# Patient Record
Sex: Male | Born: 1935 | Race: White | Hispanic: No | Smoking: Never smoker
Health system: Southern US, Community
[De-identification: ages and names within clinical notes are randomized; demographics above are authoritative.]

## PROBLEM LIST (undated history)

## (undated) DIAGNOSIS — E039 Hypothyroidism, unspecified: Secondary | ICD-10-CM

## (undated) DIAGNOSIS — C069 Malignant neoplasm of mouth, unspecified: Secondary | ICD-10-CM

## (undated) DIAGNOSIS — J45909 Unspecified asthma, uncomplicated: Secondary | ICD-10-CM

## (undated) DIAGNOSIS — H409 Unspecified glaucoma: Secondary | ICD-10-CM

## (undated) DIAGNOSIS — N400 Enlarged prostate without lower urinary tract symptoms: Secondary | ICD-10-CM

## (undated) HISTORY — DX: Unspecified glaucoma: H40.9

## (undated) HISTORY — DX: Unspecified asthma, uncomplicated: J45.909

## (undated) HISTORY — DX: Benign prostatic hyperplasia without lower urinary tract symptoms: N40.0

## (undated) HISTORY — DX: Malignant neoplasm of mouth, unspecified: C06.9

---

## 2010-12-31 HISTORY — PX: OTHER SURGICAL HISTORY: SHX169

## 2013-12-31 HISTORY — PX: POSTERIOR FUSION SPINAL DEFORMITY: SUR1044

## 2015-08-25 DIAGNOSIS — J4530 Mild persistent asthma, uncomplicated: Secondary | ICD-10-CM | POA: Insufficient documentation

## 2015-08-25 DIAGNOSIS — Z85819 Personal history of malignant neoplasm of unspecified site of lip, oral cavity, and pharynx: Secondary | ICD-10-CM | POA: Insufficient documentation

## 2015-09-09 ENCOUNTER — Ambulatory Visit: Payer: Medicare Other | Admitting: Urology

## 2015-10-28 ENCOUNTER — Encounter: Payer: Self-pay | Admitting: Urology

## 2015-10-28 ENCOUNTER — Ambulatory Visit (INDEPENDENT_AMBULATORY_CARE_PROVIDER_SITE_OTHER): Payer: Medicare Other | Admitting: Urology

## 2015-10-28 VITALS — BP 151/82 | HR 69 | Ht 69.0 in | Wt 183.2 lb

## 2015-10-28 DIAGNOSIS — N401 Enlarged prostate with lower urinary tract symptoms: Secondary | ICD-10-CM

## 2015-10-28 DIAGNOSIS — N400 Enlarged prostate without lower urinary tract symptoms: Secondary | ICD-10-CM | POA: Diagnosis not present

## 2015-10-28 DIAGNOSIS — N138 Other obstructive and reflux uropathy: Secondary | ICD-10-CM

## 2015-10-28 LAB — URINALYSIS, COMPLETE
Bilirubin, UA: NEGATIVE
GLUCOSE, UA: NEGATIVE
Ketones, UA: NEGATIVE
Leukocytes, UA: NEGATIVE
Nitrite, UA: NEGATIVE
PROTEIN UA: NEGATIVE
RBC, UA: NEGATIVE
SCAN RESULT: 15
Specific Gravity, UA: 1.025 (ref 1.005–1.030)
Urobilinogen, Ur: 0.2 mg/dL (ref 0.2–1.0)
pH, UA: 5 (ref 5.0–7.5)

## 2015-10-28 LAB — MICROSCOPIC EXAMINATION
BACTERIA UA: NONE SEEN
Epithelial Cells (non renal): NONE SEEN /hpf (ref 0–10)
RBC, UA: NONE SEEN /hpf (ref 0–?)
Renal Epithel, UA: NONE SEEN /hpf
WBC, UA: NONE SEEN /hpf (ref 0–?)

## 2015-10-28 NOTE — Progress Notes (Signed)
10/28/2015 8:54 AM   Dakota Garza 07-02-1923 [id number]  Referring provider: Kirk Ruths, MD [facility] [place], [place] [address]  Chief Complaint  Patient presents with  . Benign Prostatic Hypertrophy    referred by Dr. Frazier Richards    HPI: Patient is a 81 year old white male with BPH and LUTS was currently on tamsulosin 0.4 mg and finasteride 5 mg daily who has recently relocated here from [place]. He is having a worsening of his urinary symptoms over the last few months. Before he left [place], his urologist suggested increasing his tamsulosin and finasteride. He has not done that yet.  He did undergo a prostate vaporization in 2012. He is not anxious to undergo any further surgeries at this time.  His current symptoms consist of frequent urination, nocturia, intermittency, hesitancy and weak urinary stream.  He is I PSS score is 16/3. His PVR is 15 mL.  His UA is unremarkable.  He is not having dysuria, hematuria or suprapubic pain. He also is not having fevers, chills, nausea or vomiting.        IPSS      10/28/15 0800       International Prostate Symptom Score   How often have you had the sensation of not emptying your bladder? About half the time     How often have you had to urinate less than every two hours? About half the time     How often have you found you stopped and started again several times when you urinated? More than half the time     How often have you found it difficult to postpone urination? Not at All     How often have you had a weak urinary stream? More than half the time     How often have you had to strain to start urination? Not at All     How many times did you typically get up at night to urinate? 2 Times     Total IPSS Score 16     Quality of Life due to urinary symptoms   If you were to spend the rest of your life with your urinary condition just the way it is now how would you feel about that?  Mixed        Score:  1-7 Mild 8-19 Moderate 20-35 Severe  PMH: Past Medical History  Diagnosis Date  . Asthma in adult without complication   . Oral cancer ([place])   . BPH (benign prostatic hyperplasia)   . Glaucoma     Surgical History: Past Surgical History  Procedure Laterality Date  . Prostate vaporization  2012  . Posterior fusion spinal deformity  2015  . Oral cancer surgery  2012    Home Medications:    Medication List       This list is accurate as of: 10/28/15  8:54 AM.  Always use your most recent med list.               albuterol 108 (90 BASE) MCG/ACT inhaler  Commonly known as:  PROVENTIL HFA;VENTOLIN HFA  Inhale 2 puffs into the lungs every 6 (six) hours as needed for wheezing or shortness of breath.     aspirin EC 81 MG tablet  Take 81 mg by mouth daily.     budesonide-formoterol 160-4.5 MCG/ACT inhaler  Commonly known as:  SYMBICORT  Inhale 2 puffs into the lungs 2 (two) times daily.  cetirizine 10 MG tablet  Commonly known as:  ZYRTEC  Take 10 mg by mouth as needed for allergies.     CoQ10 100 MG Caps  Take 100 mg by mouth daily.     finasteride 5 MG tablet  Commonly known as:  PROSCAR  Take 5 mg by mouth daily.     ipratropium 0.03 % nasal spray  Commonly known as:  ATROVENT  Place 2 sprays into both nostrils every 12 (twelve) hours.     levothyroxine 50 MCG tablet  Commonly known as:  SYNTHROID, LEVOTHROID  Take 50 mcg by mouth daily before breakfast.     metoprolol tartrate 25 MG tablet  Commonly known as:  LOPRESSOR  Take 25 mg by mouth 2 (two) times daily.     montelukast 10 MG tablet  Commonly known as:  SINGULAIR  Take 10 mg by mouth at bedtime.     multivitamin tablet  Take 1 tablet by mouth daily.     polyethylene glycol packet  Commonly known as:  MIRALAX / GLYCOLAX  Take 17 g by mouth daily.     pravastatin 40 MG tablet  Commonly known as:  PRAVACHOL  Take 40 mg by mouth daily.     PRESERVISION AREDS PO    Take 1 tablet by mouth daily.     PROBIOTIC PO  Take 1 tablet by mouth daily.     tamsulosin 0.4 MG Caps capsule  Commonly known as:  FLOMAX  Take 0.4 mg by mouth.        Allergies: No Known Allergies  Family History: Family History  Problem Relation Age of Onset  . Coronary artery disease Mother   . Alzheimer's disease Father   . Coronary artery disease Sister   . Kidney disease Neg Hx   . Prostate cancer Neg Hx     Social History:  reports that he has never smoked. He does not have any smokeless tobacco history on file. He reports that he does not drink alcohol. His drug history is not on file.  ROS: UROLOGY Frequent Urination?: Yes Hard to postpone urination?: No Burning/pain with urination?: No Get up at night to urinate?: Yes Leakage of urine?: No Urine stream starts and stops?: Yes Trouble starting stream?: Yes Do you have to strain to urinate?: No Blood in urine?: No Urinary tract infection?: No Sexually transmitted disease?: No Injury to kidneys or bladder?: No Painful intercourse?: No Weak stream?: Yes Erection problems?: No Penile pain?: No  Gastrointestinal Nausea?: No Vomiting?: No Indigestion/heartburn?: No Diarrhea?: No Constipation?: Yes  Constitutional Fever: No Night sweats?: No Weight loss?: No Fatigue?: No  Skin Skin rash/lesions?: No Itching?: No  Eyes Blurred vision?: No Double vision?: No  Ears/Nose/Throat Sore throat?: No Sinus problems?: No  Hematologic/Lymphatic Swollen glands?: No Easy bruising?: Yes  Cardiovascular Leg swelling?: No Chest pain?: No  Respiratory Cough?: No Shortness of breath?: No  Endocrine Excessive thirst?: No  Musculoskeletal Back pain?: Yes Joint pain?: No  Neurological Headaches?: No Dizziness?: Yes  Psychologic Depression?: No Anxiety?: No  Physical Exam: BP 151/82 mmHg  Pulse 69  Ht 5\' 9"  (1.753 m)  Wt 183 lb 3.2 oz (83.099 kg)  BMI 27.04 kg/m2  Constitutional:  Well nourished. Alert and oriented, No acute distress. HEENT: [place] AT, moist mucus membranes. Trachea midline, no masses. Cardiovascular: No clubbing, cyanosis, or edema. Respiratory: Normal respiratory effort, no increased work of breathing. GI: Abdomen is soft, non tender, non distended, no abdominal masses. Liver and spleen not  palpable.  No hernias appreciated.  Stool sample for occult testing is not indicated.   GU: No CVA tenderness.  No bladder fullness or masses.  Patient with uncircumcised phallus. Foreskin easily retracted  Urethral meatus is patent.  No penile discharge. No penile lesions or rashes. Scrotum without lesions, cysts, rashes and/or edema.  Testicles are located scrotally bilaterally. No masses are appreciated in the testicles. Left and right epididymis are normal. Rectal: Patient with  normal sphincter tone. Anus and perineum without scarring or rashes. No rectal masses are appreciated. Prostate is approximately 50 grams, no nodules are appreciated. Firm in the right lobe.  Seminal vesicles are normal. Skin: No rashes, bruises or suspicious lesions. Lymph: No cervical or inguinal adenopathy. Neurologic: Grossly intact, no focal deficits, moving all 4 extremities. Psychiatric: Normal mood and affect.  Laboratory Data: Urinalysis Results for orders placed or performed in visit on 10/28/15  Microscopic Examination  Result Value Ref Range   WBC, UA None seen 0 -  5 /hpf   RBC, UA None seen 0 -  2 /hpf   Epithelial Cells (non renal) None seen 0 - 10 /hpf   Renal Epithel, UA None seen None seen /hpf   Mucus, UA Present (A) Not Estab.   Bacteria, UA None seen None seen/Few  Urinalysis, Complete  Result Value Ref Range   Scan Result 15   Urinalysis, Complete  Result Value Ref Range   Specific Gravity, UA 1.025 1.005 - 1.030   pH, UA 5.0 5.0 - 7.5   Color, UA Yellow Yellow   Appearance Ur Clear Clear   Leukocytes, UA Negative Negative   Protein, UA Negative  Negative/Trace   Glucose, UA Negative Negative   Ketones, UA Negative Negative   RBC, UA Negative Negative   Bilirubin, UA Negative Negative   Urobilinogen, Ur 0.2 0.2 - 1.0 mg/dL   Nitrite, UA Negative Negative   Microscopic Examination See below:     Pertinent Imaging: Results for LION, FERNANDEZ (MRN [id number]) as of 10/28/2015 09:36  Ref. Range 10/28/2015 08:42  Scan Result Unknown 15    Assessment & Plan:    1. BPH with LUTS:   Patient's IPSS score is 16/3.  His PVR 15 mL.  His DRE demonstrates mild enlargement.    Patient would like to increase his tamsulosin and finasteride.   I do not know if this would provide more benefit, but he is not anxious to have an outlet procedure at this time. He will follow up in 3 months for a PSA, PVR and an IPSS score.     Return in about 3 months (around 01/28/2016) for IPSS and PVR.  Zara Council, [facility] [address] [place], [place] [address] [phone]

## 2015-10-31 ENCOUNTER — Encounter: Payer: Self-pay | Admitting: Urology

## 2015-10-31 DIAGNOSIS — N401 Enlarged prostate with lower urinary tract symptoms: Secondary | ICD-10-CM | POA: Insufficient documentation

## 2015-12-22 ENCOUNTER — Other Ambulatory Visit: Payer: Self-pay

## 2015-12-22 DIAGNOSIS — N400 Enlarged prostate without lower urinary tract symptoms: Secondary | ICD-10-CM

## 2015-12-22 MED ORDER — TAMSULOSIN HCL 0.4 MG PO CAPS: 0.4000 mg | ORAL_CAPSULE | Freq: Every day | ORAL | Status: DC

## 2015-12-22 MED ORDER — FINASTERIDE 5 MG PO TABS: 5.0000 mg | ORAL_TABLET | Freq: Every day | ORAL | Status: DC

## 2015-12-22 NOTE — Progress Notes (Signed)
Pt requested a refill to reflect tamsulosin twice a day.

## 2016-01-30 ENCOUNTER — Encounter: Payer: Self-pay | Admitting: Urology

## 2016-01-30 ENCOUNTER — Ambulatory Visit (INDEPENDENT_AMBULATORY_CARE_PROVIDER_SITE_OTHER): Payer: Medicare Other | Admitting: Urology

## 2016-01-30 VITALS — BP 144/72 | HR 80 | Ht 69.0 in | Wt 187.2 lb

## 2016-01-30 DIAGNOSIS — N138 Other obstructive and reflux uropathy: Secondary | ICD-10-CM

## 2016-01-30 DIAGNOSIS — N401 Enlarged prostate with lower urinary tract symptoms: Secondary | ICD-10-CM

## 2016-01-30 LAB — BLADDER SCAN AMB NON-IMAGING: SCAN RESULT: 0

## 2016-01-30 NOTE — Progress Notes (Signed)
8:45 AM   Dakota Garza 28-May-1923 [id number]  Referring provider: Kirk Ruths, MD [facility] [place], [place] [address]  Chief Complaint  Patient presents with  . Benign Prostatic Hypertrophy    3 month follow up    HPI: Patient is a 82 year old Caucasian male with BPH and LUTS who presents today for 3 month follow-up after increasing his tamsulosin from 0.4 mg to 0.8 mg daily and finasteride 5 mg daily to 10 mg daily.  He states he only saw a marginal benefit in increasing the medication. He would like to return to his original dosing schedule of tamsulosin 0.4 mg and finasteride 5 mg daily.  He is experiencing nocturia 2 and urinary intermittency. His PVR today is 0 mL.  He is not had any recent dysuria, gross hematuria or suprapubic pain. He has not had any recent fevers, chills, nausea or vomiting.   He generally feels well.    He did undergo a prostate vaporization in 2012. He is not wanting  to undergo any further surgeries at this time.  His IPSS score is 11/2, the prior score was 16/3.  His PVR is 0 mL and his previous PVR was 15 mL.        IPSS      01/30/16 0800       International Prostate Symptom Score   How often have you had the sensation of not emptying your bladder? Less than 1 in 5     How often have you had to urinate less than every two hours? About half the time     How often have you found you stopped and started again several times when you urinated? Less than half the time     How often have you found it difficult to postpone urination? Not at All     How often have you had a weak urinary stream? About half the time     How often have you had to strain to start urination? Not at All     How many times did you typically get up at night to urinate? 2 Times     Total IPSS Score 11     Quality of Life due to urinary symptoms   If you were to spend the rest of your life with your urinary condition just the way it is  now how would you feel about that? Mostly Satisfied        Score:  1-7 Mild 8-19 Moderate 20-35 Severe  PMH: Past Medical History  Diagnosis Date  . Asthma in adult without complication   . Oral cancer ([place])   . BPH (benign prostatic hyperplasia)   . Glaucoma     Surgical History: Past Surgical History  Procedure Laterality Date  . Prostate vaporization  2012  . Posterior fusion spinal deformity  2015  . Oral cancer surgery  2012    Home Medications:    Medication List       This list is accurate as of: 01/30/16  8:45 AM.  Always use your most recent med list.               albuterol 108 (90 Base) MCG/ACT inhaler  Commonly known as:  PROVENTIL HFA;VENTOLIN HFA  Inhale 2 puffs into the lungs every 6 (six) hours as needed for wheezing or shortness of breath.     aspirin EC 81 MG tablet  Take 81 mg by mouth daily.  budesonide-formoterol 160-4.5 MCG/ACT inhaler  Commonly known as:  SYMBICORT  Inhale 2 puffs into the lungs 2 (two) times daily.     cetirizine 10 MG tablet  Commonly known as:  ZYRTEC  Take 10 mg by mouth as needed for allergies.     CoQ10 100 MG Caps  Take 100 mg by mouth daily.     finasteride 5 MG tablet  Commonly known as:  PROSCAR  Take 1 tablet (5 mg total) by mouth daily.     ipratropium 0.03 % nasal spray  Commonly known as:  ATROVENT  Place 2 sprays into both nostrils every 12 (twelve) hours. Reported on 01/30/2016     levothyroxine 50 MCG tablet  Commonly known as:  SYNTHROID, LEVOTHROID  Take 50 mcg by mouth daily before breakfast.     metoprolol tartrate 25 MG tablet  Commonly known as:  LOPRESSOR  Take 25 mg by mouth 2 (two) times daily.     montelukast 10 MG tablet  Commonly known as:  SINGULAIR  Take 10 mg by mouth at bedtime.     multivitamin tablet  Take 1 tablet by mouth daily.     polyethylene glycol packet  Commonly known as:  MIRALAX / GLYCOLAX  Take 17 g by mouth daily.     pravastatin 40 MG tablet    Commonly known as:  PRAVACHOL  Take 40 mg by mouth daily.     PRESERVISION AREDS PO  Take 1 tablet by mouth daily.     PROBIOTIC PO  Take 1 tablet by mouth daily.     tamsulosin 0.4 MG Caps capsule  Commonly known as:  FLOMAX  Take 1 capsule (0.4 mg total) by mouth daily.        Allergies: No Known Allergies  Family History: Family History  Problem Relation Age of Onset  . Coronary artery disease Mother   . Alzheimer's disease Father   . Coronary artery disease Sister   . Kidney disease Neg Hx   . Prostate cancer Neg Hx     Social History:  reports that he has never smoked. He does not have any smokeless tobacco history on file. He reports that he drinks alcohol. He reports that he does not use illicit drugs.  ROS: UROLOGY Frequent Urination?: No Hard to postpone urination?: No Burning/pain with urination?: No Get up at night to urinate?: Yes Leakage of urine?: No Urine stream starts and stops?: Yes Trouble starting stream?: No Do you have to strain to urinate?: No Blood in urine?: No Urinary tract infection?: No Sexually transmitted disease?: No Injury to kidneys or bladder?: No Painful intercourse?: No Weak stream?: No Erection problems?: No Penile pain?: No  Gastrointestinal Nausea?: No Vomiting?: No Indigestion/heartburn?: No Diarrhea?: No Constipation?: Yes  Constitutional Fever: No Night sweats?: No Weight loss?: No Fatigue?: No  Skin Skin rash/lesions?: No Itching?: No  Eyes Blurred vision?: No Double vision?: No  Ears/Nose/Throat Sore throat?: No Sinus problems?: No  Hematologic/Lymphatic Swollen glands?: No Easy bruising?: No  Cardiovascular Leg swelling?: No Chest pain?: No  Respiratory Cough?: No Shortness of breath?: No  Endocrine Excessive thirst?: No  Musculoskeletal Back pain?: Yes Joint pain?: No  Neurological Headaches?: No Dizziness?: No  Psychologic Depression?: No Anxiety?: No  Physical  Exam: BP 144/72 mmHg  Pulse 80  Ht 5\' 9"  (1.753 m)  Wt 187 lb 3.2 oz (84.913 kg)  BMI 27.63 kg/m2  Constitutional: Well nourished. Alert and oriented, No acute distress. HEENT: [place] AT, moist mucus  membranes. Trachea midline, no masses. Cardiovascular: No clubbing, cyanosis, or edema. Respiratory: Normal respiratory effort, no increased work of breathing. Skin: No rashes, bruises or suspicious lesions. Lymph: No cervical or inguinal adenopathy. Neurologic: Grossly intact, no focal deficits, moving all 4 extremities. Psychiatric: Normal mood and affect.  Laboratory Data: Pertinent Imaging: Results for Dakota Garza, Dakota Garza (MRN [id number]) as of 01/30/2016 08:51  Ref. Range 01/30/2016 08:32  Scan Result Unknown 0   Assessment & Plan:    1. BPH with LUTS:   Patient's IPSS score is 11/2.  His PVR 0 mL.   Patient will continue his tamsulosin 0.4 mg daily and finasteride 5 mg daily.  He will follow up in 9 months for a PSA, exam, PVR and an IPSS score.     Return in about 9 months (around 10/29/2016) for IPSS score and exam.  Zara Council, [facility] [address], [place], [place] [address] [phone]

## 2016-02-27 DIAGNOSIS — C44310 Basal cell carcinoma of skin of unspecified parts of face: Secondary | ICD-10-CM | POA: Insufficient documentation

## 2016-02-27 DIAGNOSIS — E7800 Pure hypercholesterolemia, unspecified: Secondary | ICD-10-CM | POA: Insufficient documentation

## 2016-10-29 ENCOUNTER — Encounter: Payer: Self-pay | Admitting: Urology

## 2016-10-29 ENCOUNTER — Ambulatory Visit: Payer: Medicare Other | Admitting: Urology

## 2016-10-29 DIAGNOSIS — N401 Enlarged prostate with lower urinary tract symptoms: Secondary | ICD-10-CM

## 2016-10-29 MED ORDER — TAMSULOSIN HCL 0.4 MG PO CAPS: 0.4000 mg | ORAL_CAPSULE | Freq: Every day | ORAL | 4 refills | Status: DC

## 2016-10-29 MED ORDER — FINASTERIDE 5 MG PO TABS: 5.0000 mg | ORAL_TABLET | Freq: Every day | ORAL | 4 refills | Status: DC

## 2016-10-29 NOTE — Progress Notes (Signed)
8:52 AM   [name] 02-25-1923 [id number]  Referring provider: Kirk Ruths, MD [facility] [place], [place] [address]  Chief Complaint  Patient presents with  . Benign Prostatic Hypertrophy    HPI: Patient is a 82 year old Caucasian male with BPH and LUTS who presents today for 9 month follow-up.  BPH WITH LUTS His IPSS score today is 11, which is moderate lower urinary tract symptomatology. He is mostly satisfied with his quality life due to his urinary symptoms.  His previous IPSS score was 11/2.  His major complaint today nocturia x 1, intermittency, hesitancy and a weak stream.  He has had these symptoms for several years.  He denies any dysuria, hematuria or suprapubic pain.   He currently taking tamsulosin 0.4 mg daily and finasteride 5 mg daily.  He did undergo a prostate vaporization in 2012.  He is not wanting  to undergo any further surgeries at this time.  He also denies any recent fevers, chills, nausea or vomiting.  He does not have a family history of PCa.      IPSS    Row Name 10/29/16 0800         International Prostate Symptom Score   How often have you had the sensation of not emptying your bladder? Less than half the time     How often have you had to urinate less than every two hours? Less than half the time     How often have you found you stopped and started again several times when you urinated? Less than half the time     How often have you found it difficult to postpone urination? Not at All     How often have you had a weak urinary stream? About half the time     How often have you had to strain to start urination? Less than 1 in 5 times     How many times did you typically get up at night to urinate? 1 Time     Total IPSS Score 11       Quality of Life due to urinary symptoms   If you were to spend the rest of your life with your urinary condition just the way it is now how would you feel about that? Mostly  Satisfied        Score:  1-7 Mild 8-19 Moderate 20-35 Severe   PMH: Past Medical History:  Diagnosis Date  . Asthma in adult without complication   . BPH (benign prostatic hyperplasia)   . Glaucoma   . Oral cancer [facility])     Surgical History: Past Surgical History:  Procedure Laterality Date  . Oral cancer Surgery  2012  . POSTERIOR FUSION SPINAL DEFORMITY  2015  . prostate vaporization  2012    Home Medications:    Medication List       Accurate as of 10/29/16  8:52 AM. Always use your most recent med list.          albuterol 108 (90 Base) MCG/ACT inhaler Commonly known as:  PROVENTIL HFA;VENTOLIN HFA Inhale 2 puffs into the lungs every 6 (six) hours as needed for wheezing or shortness of breath.   aspirin EC 81 MG tablet Take 81 mg by mouth daily.   budesonide-formoterol 160-4.5 MCG/ACT inhaler Commonly known as:  SYMBICORT Inhale 2 puffs into the lungs 2 (two) times daily.   cetirizine 10 MG tablet Commonly known as:  ZYRTEC Take  10 mg by mouth as needed for allergies.   CoQ10 100 MG Caps Take 100 mg by mouth daily.   finasteride 5 MG tablet Commonly known as:  PROSCAR Take 1 tablet (5 mg total) by mouth daily.   ipratropium 0.03 % nasal spray Commonly known as:  ATROVENT Place 2 sprays into both nostrils every 12 (twelve) hours. Reported on 01/30/2016   levothyroxine 50 MCG tablet Commonly known as:  SYNTHROID, LEVOTHROID Take 50 mcg by mouth daily before breakfast.   metoprolol tartrate 25 MG tablet Commonly known as:  LOPRESSOR Take 25 mg by mouth 2 (two) times daily.   montelukast 10 MG tablet Commonly known as:  SINGULAIR Take 10 mg by mouth at bedtime.   multivitamin tablet Take 1 tablet by mouth daily.   polyethylene glycol packet Commonly known as:  MIRALAX / GLYCOLAX Take 17 g by mouth daily.   pravastatin 40 MG tablet Commonly known as:  PRAVACHOL Take 40 mg by mouth daily.   PRESERVISION AREDS PO Take 1 tablet by mouth  daily.   PROBIOTIC PO Take 1 tablet by mouth daily.   tamsulosin 0.4 MG Caps capsule Commonly known as:  FLOMAX Take 1 capsule (0.4 mg total) by mouth daily.       Allergies: No Known Allergies  Family History: Family History  Problem Relation Age of Onset  . Coronary artery disease Mother   . Alzheimer's disease Father   . Coronary artery disease Sister   . Kidney disease Neg Hx   . Prostate cancer Neg Hx     Social History:  reports that he has never smoked. He has never used smokeless tobacco. He reports that he drinks alcohol. He reports that he does not use drugs.  ROS: UROLOGY Frequent Urination?: No Hard to postpone urination?: No Burning/pain with urination?: No Get up at night to urinate?: Yes Leakage of urine?: No Urine stream starts and stops?: Yes Trouble starting stream?: Yes Do you have to strain to urinate?: No Blood in urine?: No Urinary tract infection?: No Sexually transmitted disease?: No Injury to kidneys or bladder?: No Painful intercourse?: No Weak stream?: Yes Erection problems?: No Penile pain?: No  Gastrointestinal Nausea?: No Vomiting?: No Indigestion/heartburn?: No Diarrhea?: No Constipation?: Yes  Constitutional Fever: No Night sweats?: No Weight loss?: No Fatigue?: No  Skin Skin rash/lesions?: No Itching?: No  Eyes Blurred vision?: No Double vision?: No  Ears/Nose/Throat Sore throat?: No Sinus problems?: Yes  Hematologic/Lymphatic Swollen glands?: No Easy bruising?: No  Cardiovascular Leg swelling?: No Chest pain?: No  Respiratory Cough?: Yes Shortness of breath?: No  Endocrine Excessive thirst?: No  Musculoskeletal Back pain?: Yes Joint pain?: No  Neurological Headaches?: No Dizziness?: No  Psychologic Depression?: No Anxiety?: No  Physical Exam: BP (!) 153/79 (BP Location: Left Arm, Patient Position: Sitting, Cuff Size: Normal)   Pulse 80   Ht 5\' 9"  (1.753 m)   Wt 182 lb 11.2 oz (82.9  kg)   BMI 26.98 kg/m   Constitutional: Well nourished. Alert and oriented, No acute distress. HEENT: [place] AT, moist mucus membranes. Trachea midline, no masses. Cardiovascular: No clubbing, cyanosis, or edema. Respiratory: Normal respiratory effort, no increased work of breathing. GI: Abdomen is soft, non tender, non distended, no abdominal masses. Liver and spleen not palpable.  No hernias appreciated.  Stool sample for occult testing is not indicated.   GU: No CVA tenderness.  No bladder fullness or masses.  Patient with uncircumcised phallus.  Foreskin easily retracted Urethral meatus is  patent.  No penile discharge. No penile lesions or rashes. Scrotum without lesions, cysts, rashes and/or edema.  Testicles are located scrotally bilaterally. No masses are appreciated in the testicles. Left and right epididymis are normal. Rectal: Patient with  normal sphincter tone. Anus and perineum without scarring or rashes. No rectal masses are appreciated. Prostate is approximately 50 grams, firm in the right lobe, no nodules are appreciated. Seminal vesicles are normal. Skin: No rashes, bruises or suspicious lesions. Lymph: No cervical or inguinal adenopathy. Neurologic: Grossly intact, no focal deficits, moving all 4 extremities. Psychiatric: Normal mood and affect.    Assessment & Plan:    1. BPH with LUTS  - IPSS score is 11/2, it is stable  - Continue conservative management, avoiding bladder irritants and timed voiding's  - Continue tamsulosin 0.4 mg daily and finasteride 5 mg daily; refills given  - RTC in 12 months for IPSS and exam   Return in about 1 year (around 10/29/2017) for IPSS and exam.  Zara Council, [facility]  [facility] [address], [place], [place] [address] [phone]

## 2017-06-11 DIAGNOSIS — K219 Gastro-esophageal reflux disease without esophagitis: Secondary | ICD-10-CM | POA: Insufficient documentation

## 2017-09-25 DIAGNOSIS — E039 Hypothyroidism, unspecified: Secondary | ICD-10-CM | POA: Insufficient documentation

## 2017-09-25 DIAGNOSIS — Z0000 Encounter for general adult medical examination without abnormal findings: Secondary | ICD-10-CM | POA: Insufficient documentation

## 2017-10-28 NOTE — Progress Notes (Signed)
8:43 AM   [name] 03-09-1923 [id number]  Referring provider: Kirk Ruths, MD [facility] [place], [place] [address]  Chief Complaint  Patient presents with  . Benign Prostatic Hypertrophy  . Follow-up    HPI: Patient is a 83 year old Caucasian male with BPH and LUTS who presents today for 1 year follow-up.  BPH WITH LUTS His IPSS score today is 10, which is moderate lower urinary tract symptomatology. He is mixed with his quality life due to his urinary symptoms.  His previous IPSS score was 11/2.  His major complaint today nocturia x 1, intermittency, hesitancy and a weak stream.  He has had these symptoms for several years.  He denies any dysuria, hematuria or suprapubic pain.   He currently taking tamsulosin 0.4 mg daily and finasteride 5 mg daily.  He did undergo a prostate vaporization in 2012.  He is not wanting  to undergo any further surgeries at this time.  He also denies any recent fevers, chills, nausea or vomiting.  He does not have a family history of PCa.     IPSS    Row Name 10/29/17 0800         International Prostate Symptom Score   How often have you had the sensation of not emptying your bladder? Less than half the time     How often have you had to urinate less than every two hours? Less than half the time     How often have you found you stopped and started again several times when you urinated? Less than half the time     How often have you found it difficult to postpone urination? Not at All     How often have you had a weak urinary stream? About half the time     How often have you had to strain to start urination? Not at All     How many times did you typically get up at night to urinate? 1 Time     Total IPSS Score 10       Quality of Life due to urinary symptoms   If you were to spend the rest of your life with your urinary condition just the way it is now how would you feel about that? Mixed         Score:  1-7 Mild 8-19 Moderate 20-35 Severe   PMH: Past Medical History:  Diagnosis Date  . Asthma in adult without complication   . BPH (benign prostatic hyperplasia)   . Glaucoma   . Oral cancer [facility])     Surgical History: Past Surgical History:  Procedure Laterality Date  . Oral cancer Surgery  2012  . POSTERIOR FUSION SPINAL DEFORMITY  2015  . prostate vaporization  2012    Home Medications:  Allergies as of 10/29/2017   No Known Allergies     Medication List       Accurate as of 10/29/17  8:43 AM. Always use your most recent med list.          albuterol 108 (90 Base) MCG/ACT inhaler Commonly known as:  PROVENTIL HFA;VENTOLIN HFA Inhale 2 puffs into the lungs every 6 (six) hours as needed for wheezing or shortness of breath.   aspirin EC 81 MG tablet Take 81 mg by mouth daily.   budesonide-formoterol 160-4.5 MCG/ACT inhaler Commonly known as:  SYMBICORT Inhale 2 puffs into the lungs 2 (two) times daily.   cetirizine 10  MG tablet Commonly known as:  ZYRTEC Take 10 mg by mouth as needed for allergies.   CoQ10 100 MG Caps Take 100 mg by mouth daily.   finasteride 5 MG tablet Commonly known as:  PROSCAR Take 1 tablet (5 mg total) by mouth daily.   ipratropium 0.03 % nasal spray Commonly known as:  ATROVENT Place 2 sprays into both nostrils every 12 (twelve) hours. Reported on 01/30/2016   levothyroxine 50 MCG tablet Commonly known as:  SYNTHROID, LEVOTHROID Take 50 mcg by mouth daily before breakfast.   metoprolol tartrate 25 MG tablet Commonly known as:  LOPRESSOR Take 25 mg by mouth 2 (two) times daily.   montelukast 10 MG tablet Commonly known as:  SINGULAIR Take 10 mg by mouth at bedtime.   multivitamin tablet Take 1 tablet by mouth daily.   pantoprazole 40 MG tablet Commonly known as:  PROTONIX Take by mouth.   polyethylene glycol packet Commonly known as:  MIRALAX / GLYCOLAX Take 17 g by mouth daily.   pravastatin 40 MG  tablet Commonly known as:  PRAVACHOL Take 40 mg by mouth daily.   PRESERVISION AREDS PO Take 1 tablet by mouth daily.   PROBIOTIC PO Take 1 tablet by mouth daily.   tamsulosin 0.4 MG Caps capsule Commonly known as:  FLOMAX Take 1 capsule (0.4 mg total) by mouth daily.       Allergies: No Known Allergies  Family History: Family History  Problem Relation Age of Onset  . Coronary artery disease Mother   . Alzheimer's disease Father   . Coronary artery disease Sister   . Kidney disease Neg Hx   . Prostate cancer Neg Hx     Social History:  reports that he has never smoked. He has never used smokeless tobacco. He reports that he drinks alcohol. He reports that he does not use drugs.  ROS: UROLOGY Frequent Urination?: No Hard to postpone urination?: No Burning/pain with urination?: No Get up at night to urinate?: Yes Leakage of urine?: No Urine stream starts and stops?: Yes Trouble starting stream?: Yes Do you have to strain to urinate?: No Blood in urine?: No Urinary tract infection?: No Sexually transmitted disease?: No Injury to kidneys or bladder?: No Painful intercourse?: No Weak stream?: Yes Erection problems?: No Penile pain?: No  Gastrointestinal Nausea?: No Vomiting?: No Indigestion/heartburn?: No Diarrhea?: No Constipation?: Yes  Constitutional Fever: No Night sweats?: No Weight loss?: No Fatigue?: No  Skin Skin rash/lesions?: No Itching?: No  Eyes Blurred vision?: No Double vision?: No  Ears/Nose/Throat Sore throat?: No Sinus problems?: No  Hematologic/Lymphatic Swollen glands?: No Easy bruising?: Yes  Cardiovascular Leg swelling?: No Chest pain?: No  Respiratory Cough?: Yes Shortness of breath?: No  Endocrine Excessive thirst?: No  Musculoskeletal Back pain?: No Joint pain?: No  Neurological Headaches?: No Dizziness?: No  Psychologic Depression?: No Anxiety?: No  Physical Exam: BP (!) 157/74   Pulse 77   Ht  5\' 9"  (1.753 m)   Wt 184 lb (83.5 kg)   BMI 27.17 kg/m   Constitutional: Well nourished. Alert and oriented, No acute distress. HEENT: [place] AT, moist mucus membranes. Trachea midline, no masses. Cardiovascular: No clubbing, cyanosis, or edema. Respiratory: Normal respiratory effort, no increased work of breathing. GI: Abdomen is soft, non tender, non distended, no abdominal masses. Liver and spleen not palpable.  No hernias appreciated.  Stool sample for occult testing is not indicated.   GU: No CVA tenderness.  No bladder fullness or masses.  Patient  with uncircumcised phallus.  Foreskin easily retracted Urethral meatus is patent.  No penile discharge. No penile lesions or rashes. Scrotum without lesions, cysts, rashes and/or edema.  Testicles are located scrotally bilaterally. No masses are appreciated in the testicles. Left and right epididymis are normal. Rectal: Patient with  normal sphincter tone. Anus and perineum without scarring or rashes. No rectal masses are appreciated. Prostate is approximately 50 grams, firm in the right lobe, no nodules are appreciated. Seminal vesicles are normal. Skin: No rashes, bruises or suspicious lesions. Lymph: No cervical or inguinal adenopathy. Neurologic: Grossly intact, no focal deficits, moving all 4 extremities. Psychiatric: Normal mood and affect.    Assessment & Plan:    1. BPH with LUTS  - IPSS score is 10/3, it is stable  - Continue conservative management, avoiding bladder irritants and timed voiding's  - Continue tamsulosin 0.4 mg daily and finasteride 5 mg daily; refills given  - RTC in 12 months for IPSS and exam   Return in about 1 year (around 10/29/2018) for I PSS and exam. .  Zara Council, [facility] [address], [place], [place] [address] [phone]

## 2017-10-29 ENCOUNTER — Encounter: Payer: Self-pay | Admitting: Urology

## 2017-10-29 ENCOUNTER — Ambulatory Visit: Payer: Medicare Other | Admitting: Urology

## 2017-10-29 VITALS — BP 157/74 | HR 77 | Ht 69.0 in | Wt 184.0 lb

## 2017-10-29 DIAGNOSIS — N401 Enlarged prostate with lower urinary tract symptoms: Secondary | ICD-10-CM

## 2017-10-29 DIAGNOSIS — N138 Other obstructive and reflux uropathy: Secondary | ICD-10-CM

## 2017-10-29 MED ORDER — TAMSULOSIN HCL 0.4 MG PO CAPS: 0.4000 mg | ORAL_CAPSULE | Freq: Every day | ORAL | 4 refills | Status: DC

## 2017-10-29 MED ORDER — FINASTERIDE 5 MG PO TABS: 5.0000 mg | ORAL_TABLET | Freq: Every day | ORAL | 4 refills | Status: DC

## 2018-03-26 DIAGNOSIS — K5904 Chronic idiopathic constipation: Secondary | ICD-10-CM | POA: Insufficient documentation

## 2018-10-23 ENCOUNTER — Other Ambulatory Visit: Payer: Self-pay | Admitting: Urology

## 2018-10-23 DIAGNOSIS — N401 Enlarged prostate with lower urinary tract symptoms: Secondary | ICD-10-CM

## 2018-10-28 ENCOUNTER — Encounter: Payer: Self-pay | Admitting: Urology

## 2018-10-28 ENCOUNTER — Ambulatory Visit: Payer: Medicare Other | Admitting: Urology

## 2018-10-28 VITALS — BP 127/73 | HR 82 | Ht 69.0 in | Wt 183.0 lb

## 2018-10-28 DIAGNOSIS — N401 Enlarged prostate with lower urinary tract symptoms: Secondary | ICD-10-CM

## 2018-10-28 DIAGNOSIS — N138 Other obstructive and reflux uropathy: Secondary | ICD-10-CM | POA: Diagnosis not present

## 2018-10-28 MED ORDER — TAMSULOSIN HCL 0.4 MG PO CAPS: 0.4000 mg | ORAL_CAPSULE | Freq: Every day | ORAL | 4 refills | Status: DC

## 2018-10-28 MED ORDER — FINASTERIDE 5 MG PO TABS: 5.0000 mg | ORAL_TABLET | Freq: Every day | ORAL | 4 refills | Status: DC

## 2018-10-28 NOTE — Progress Notes (Signed)
9:14 AM   Dakota Garza 1923-07-14 [id number]  Referring provider: Kirk Ruths, MD [place] [facility] [place], [place] [address]  Chief Complaint  Patient presents with  . Establish Care    follow up    HPI: Patient is a 84 year old Caucasian male with BPH and LUTS who presents today for 1 year follow-up.  BPH WITH LUTS His IPSS score today is 15, which is moderate lower urinary tract symptomatology. He is mixed with his quality life due to his urinary symptoms.  His previous IPSS score was 10/3.  His major complaint today frequency, nocturia x 1, intermittency, hesitancy and a weak stream.  He has had these symptoms for several years.  He denies any dysuria, hematuria or suprapubic pain.   He currently taking tamsulosin 0.4 mg daily and finasteride 5 mg daily.  He did undergo a prostate vaporization in 2012.  He is not wanting  to undergo any further surgeries at this time.  He also denies any recent fevers, chills, nausea or vomiting.  He does not have a family history of PCa. IPSS    Row Name 10/28/18 0800         International Prostate Symptom Score   How often have you had the sensation of not emptying your bladder?  Less than 1 in 5     How often have you had to urinate less than every two hours?  Less than half the time     How often have you found you stopped and started again several times when you urinated?  About half the time     How often have you found it difficult to postpone urination?  Less than 1 in 5 times     How often have you had a weak urinary stream?  Almost always     How often have you had to strain to start urination?  Less than 1 in 5 times     How many times did you typically get up at night to urinate?  2 Times     Total IPSS Score  15       Quality of Life due to urinary symptoms   If you were to spend the rest of your life with your urinary condition just the way it is now how would you feel about that?  Mixed        Score:  1-7 Mild 8-19 Moderate 20-35 Severe   PMH: Past Medical History:  Diagnosis Date  . Asthma in adult without complication   . BPH (benign prostatic hyperplasia)   . Glaucoma   . Oral cancer [facility])     Surgical History: Past Surgical History:  Procedure Laterality Date  . Oral cancer Surgery  2012  . POSTERIOR FUSION SPINAL DEFORMITY  2015  . prostate vaporization  2012    Home Medications:  Allergies as of 10/28/2018   No Known Allergies     Medication List        Accurate as of 10/28/18  9:14 AM. Always use your most recent med list.          albuterol 108 (90 Base) MCG/ACT inhaler Commonly known as:  PROVENTIL HFA;VENTOLIN HFA Inhale 2 puffs into the lungs every 6 (six) hours as needed for wheezing or shortness of breath.   aspirin EC 81 MG tablet Take 81 mg by mouth daily.   budesonide-formoterol 160-4.5 MCG/ACT inhaler Commonly known as:  SYMBICORT Inhale 2 puffs into  the lungs 2 (two) times daily.   cetirizine 10 MG tablet Commonly known as:  ZYRTEC Take 10 mg by mouth as needed for allergies.   CoQ10 100 MG Caps Take 100 mg by mouth daily.   finasteride 5 MG tablet Commonly known as:  PROSCAR Take 1 tablet (5 mg total) by mouth daily.   ipratropium 0.03 % nasal spray Commonly known as:  ATROVENT Place 2 sprays into both nostrils every 12 (twelve) hours. Reported on 01/30/2016   levothyroxine 50 MCG tablet Commonly known as:  SYNTHROID, LEVOTHROID Take 50 mcg by mouth daily before breakfast.   metoprolol tartrate 25 MG tablet Commonly known as:  LOPRESSOR Take 25 mg by mouth 2 (two) times daily.   montelukast 10 MG tablet Commonly known as:  SINGULAIR Take 10 mg by mouth at bedtime.   multivitamin tablet Take 1 tablet by mouth daily.   pantoprazole 40 MG tablet Commonly known as:  PROTONIX Take by mouth.   polyethylene glycol packet Commonly known as:  MIRALAX / GLYCOLAX Take 17 g by mouth daily.   pravastatin 40 MG  tablet Commonly known as:  PRAVACHOL Take 40 mg by mouth daily.   PRESERVISION AREDS PO Take 1 tablet by mouth daily.   PROBIOTIC PO Take 1 tablet by mouth daily.   tamsulosin 0.4 MG Caps capsule Commonly known as:  FLOMAX Take 1 capsule (0.4 mg total) by mouth daily.       Allergies: No Known Allergies  Family History: Family History  Problem Relation Age of Onset  . Coronary artery disease Mother   . Alzheimer's disease Father   . Coronary artery disease Sister   . Kidney disease Neg Hx   . Prostate cancer Neg Hx     Social History:  reports that he has never smoked. He has never used smokeless tobacco. He reports that he drinks alcohol. He reports that he does not use drugs.  ROS: UROLOGY Frequent Urination?: Yes Hard to postpone urination?: No Burning/pain with urination?: No Get up at night to urinate?: Yes Leakage of urine?: No Urine stream starts and stops?: Yes Trouble starting stream?: Yes Do you have to strain to urinate?: No Blood in urine?: No Urinary tract infection?: No Sexually transmitted disease?: No Injury to kidneys or bladder?: No Painful intercourse?: No Weak stream?: Yes Erection problems?: No Penile pain?: No  Gastrointestinal Nausea?: No Vomiting?: No Indigestion/heartburn?: No Diarrhea?: No Constipation?: Yes  Constitutional Fever: No Night sweats?: No Weight loss?: No Fatigue?: No  Skin Skin rash/lesions?: No Itching?: No  Eyes Blurred vision?: No Double vision?: No  Ears/Nose/Throat Sore throat?: No Sinus problems?: No  Hematologic/Lymphatic Swollen glands?: No Easy bruising?: No  Cardiovascular Leg swelling?: No Chest pain?: No  Respiratory Cough?: No Shortness of breath?: No  Endocrine Excessive thirst?: No  Musculoskeletal Back pain?: Yes Joint pain?: No  Neurological Headaches?: No Dizziness?: No  Psychologic Depression?: No Anxiety?: No  Physical Exam: BP 127/73 (BP Location: Left  Arm, Patient Position: Sitting, Cuff Size: Normal)   Pulse 82   Ht 5\' 9"  (1.753 m)   Wt 183 lb (83 kg)   BMI 27.02 kg/m   Constitutional: Well nourished. Alert and oriented, No acute distress. HEENT: [place] AT, moist mucus membranes. Trachea midline, no masses. Cardiovascular: No clubbing, cyanosis, or edema. Respiratory: Normal respiratory effort, no increased work of breathing. Skin: No rashes, bruises or suspicious lesions. Lymph: No cervical or inguinal adenopathy. Neurologic: Grossly intact, no focal deficits, moving all 4 extremities. Psychiatric:  Normal mood and affect.  Assessment & Plan:    1. BPH with LUTS  - IPSS score is 15/3, it is worsening   - Continue conservative management, avoiding bladder irritants and timed voiding's  - Continue tamsulosin 0.4 mg daily and finasteride 5 mg daily; refills given  - RTC in 12 months for IPSS and exam   Return in about 1 year (around 10/29/2019) for IPSS, PVR and exam.  Zara Council, [facility]  [place],  [address] [phone]

## 2018-11-10 ENCOUNTER — Telehealth: Payer: Self-pay | Admitting: Urology

## 2018-11-10 NOTE — Telephone Encounter (Signed)
Please schedule Mr. Markuson an appointment with Dr. Bernardo Heater for further discussion regarding Urolift or other BPH procedures he may be a candidate.

## 2018-11-10 NOTE — Telephone Encounter (Signed)
Called patient LM to cb Made app and mailed  Dakota Garza

## 2018-11-21 ENCOUNTER — Encounter: Payer: Self-pay | Admitting: Urology

## 2018-11-21 ENCOUNTER — Ambulatory Visit: Payer: Medicare Other | Admitting: Urology

## 2018-11-21 VITALS — BP 149/71 | HR 92 | Ht 69.0 in | Wt 184.4 lb

## 2018-11-21 DIAGNOSIS — N138 Other obstructive and reflux uropathy: Secondary | ICD-10-CM | POA: Diagnosis not present

## 2018-11-21 DIAGNOSIS — N401 Enlarged prostate with lower urinary tract symptoms: Secondary | ICD-10-CM

## 2018-11-21 LAB — BLADDER SCAN AMB NON-IMAGING

## 2018-11-21 NOTE — Progress Notes (Signed)
11/21/2018 10:18 AM   Dakota Garza 1923-08-31 [id number]  Referring provider: Kirk Ruths, MD [place] [facility] [place], [place] [address]  Chief Complaint  Patient presents with  . Benign Prostatic Hypertrophy    HPI: 84 year old male with a long history of BPH presents today to discuss BPH procedures.  He has been followed by La[facility] since 2016.  He is status post PVP in [place] in 2012 and noted marked improvement in his lower urinary tract symptoms however 2 years after the procedure he had gradually progressive recurrent voiding symptoms.  He has been on tamsulosin and finasteride since at least 2016.  He currently complains of occasional sensation of incomplete emptying, frequency, urgency, straining to urinate, nocturia x2, intermittent urinary stream and a weak urinary stream.  IPSS completed today was 20/35 with a quality of life rated 4/6.  He denies dysuria or gross hematuria.  He has no flank, abdominal, pelvic or scrotal pain.     PMH: Past Medical History:  Diagnosis Date  . Asthma in adult without complication   . BPH (benign prostatic hyperplasia)   . Glaucoma   . Oral cancer [facility])     Surgical History: Past Surgical History:  Procedure Laterality Date  . Oral cancer Surgery  2012  . POSTERIOR FUSION SPINAL DEFORMITY  2015  . prostate vaporization  2012    Home Medications:  Allergies as of 11/21/2018   No Known Allergies     Medication List        Accurate as of 11/21/18 10:18 AM. Always use your most recent med list.          albuterol 108 (90 Base) MCG/ACT inhaler Commonly known as:  PROVENTIL HFA;VENTOLIN HFA Inhale 2 puffs into the lungs every 6 (six) hours as needed for wheezing or shortness of breath.   aspirin EC 81 MG tablet Take 81 mg by mouth daily.   budesonide-formoterol 160-4.5 MCG/ACT inhaler Commonly known as:  SYMBICORT Inhale 2 puffs into the lungs 2 (two) times daily.   cetirizine 10 MG  tablet Commonly known as:  ZYRTEC Take 10 mg by mouth as needed for allergies.   CoQ10 100 MG Caps Take 100 mg by mouth daily.   finasteride 5 MG tablet Commonly known as:  PROSCAR Take 1 tablet (5 mg total) by mouth daily.   ipratropium 0.03 % nasal spray Commonly known as:  ATROVENT Place 2 sprays into both nostrils every 12 (twelve) hours. Reported on 01/30/2016   levothyroxine 50 MCG tablet Commonly known as:  SYNTHROID, LEVOTHROID Take 50 mcg by mouth daily before breakfast.   metoprolol tartrate 25 MG tablet Commonly known as:  LOPRESSOR Take 25 mg by mouth 2 (two) times daily.   montelukast 10 MG tablet Commonly known as:  SINGULAIR Take 10 mg by mouth at bedtime.   multivitamin tablet Take 1 tablet by mouth daily.   pantoprazole 40 MG tablet Commonly known as:  PROTONIX Take by mouth.   polyethylene glycol packet Commonly known as:  MIRALAX / GLYCOLAX Take 17 g by mouth daily.   pravastatin 40 MG tablet Commonly known as:  PRAVACHOL Take 40 mg by mouth daily.   PRESERVISION AREDS PO Take 1 tablet by mouth daily.   PROBIOTIC PO Take 1 tablet by mouth daily.   tamsulosin 0.4 MG Caps capsule Commonly known as:  FLOMAX Take 1 capsule (0.4 mg total) by mouth daily.       Allergies: No Known Allergies  Family History: Family History  Problem Relation Age of Onset  . Coronary artery disease Mother   . Alzheimer's disease Father   . Coronary artery disease Sister   . Kidney disease Neg Hx   . Prostate cancer Neg Hx     Social History:  reports that he has never smoked. He has never used smokeless tobacco. He reports that he drinks alcohol. He reports that he does not use drugs.  ROS: UROLOGY Frequent Urination?: Yes Hard to postpone urination?: No Burning/pain with urination?: No Get up at night to urinate?: Yes Leakage of urine?: No Urine stream starts and stops?: Yes Trouble starting stream?: Yes Do you have to strain to urinate?:  No Blood in urine?: No Urinary tract infection?: No Sexually transmitted disease?: No Injury to kidneys or bladder?: No Painful intercourse?: No Weak stream?: Yes Erection problems?: No Penile pain?: No  Gastrointestinal Nausea?: No Vomiting?: No Indigestion/heartburn?: No Diarrhea?: No Constipation?: Yes  Constitutional Fever: No Night sweats?: No Weight loss?: No Fatigue?: No  Skin Skin rash/lesions?: No Itching?: No  Eyes Blurred vision?: No Double vision?: No  Ears/Nose/Throat Sore throat?: No Sinus problems?: No  Hematologic/Lymphatic Swollen glands?: No Easy bruising?: Yes  Cardiovascular Leg swelling?: No Chest pain?: No  Respiratory Cough?: No Shortness of breath?: No  Endocrine Excessive thirst?: No  Musculoskeletal Back pain?: Yes Joint pain?: No  Neurological Headaches?: No Dizziness?: No  Psychologic Depression?: No Anxiety?: No  Physical Exam: BP (!) 149/71 (BP Location: Left Arm, Patient Position: Sitting, Cuff Size: Normal)   Pulse 92   Ht 5\' 9"  (1.753 m)   Wt 184 lb 6.4 oz (83.6 kg)   BMI 27.23 kg/m   Constitutional:  Alert and oriented, No acute distress. HEENT:  AT, moist mucus membranes.  Trachea midline, no masses. Cardiovascular: No clubbing, cyanosis, or edema. Respiratory: Normal respiratory effort, no increased work of breathing. Skin: No rashes, bruises or suspicious lesions. Neurologic: Grossly intact, no focal deficits, moving all 4 extremities. Psychiatric: Normal mood and affect.   Assessment & Plan:   84 year old male with BPH on maximum medical management with bothersome lower urinary tract symptoms.  He was primarily interested in [place] which was discussed in detail.  TURP and PVP were also discussed.  He was very interested in UroLift and would like to talk this over with his PCP and daughter.  If he desires to proceed will need to schedule office cystoscopy and TRUS for volume.  He indicated he will  call back to schedule if he wants to proceed.  PVR by bladder scan today was 16 mL.   Abbie Sons, [place] [address], [place],  [address] [phone]

## 2019-01-07 ENCOUNTER — Ambulatory Visit: Payer: Medicare Other | Admitting: Urology

## 2019-01-07 ENCOUNTER — Encounter: Payer: Self-pay | Admitting: Urology

## 2019-01-07 VITALS — BP 153/71 | HR 88 | Ht 69.0 in | Wt 183.6 lb

## 2019-01-07 DIAGNOSIS — N401 Enlarged prostate with lower urinary tract symptoms: Secondary | ICD-10-CM

## 2019-01-07 DIAGNOSIS — N138 Other obstructive and reflux uropathy: Secondary | ICD-10-CM | POA: Diagnosis not present

## 2019-01-07 LAB — URINALYSIS, COMPLETE
BILIRUBIN UA: NEGATIVE
Glucose, UA: NEGATIVE
Ketones, UA: NEGATIVE
Leukocytes, UA: NEGATIVE
NITRITE UA: NEGATIVE
PH UA: 5.5 (ref 5.0–7.5)
Protein, UA: NEGATIVE
RBC, UA: NEGATIVE
Specific Gravity, UA: >1.03 — ABNORMAL HIGH (ref 1.005–1.030)
UUROB: 0.2 mg/dL (ref 0.2–1.0)

## 2019-01-07 LAB — MICROSCOPIC EXAMINATION
BACTERIA UA: NONE SEEN
EPITHELIAL CELLS (NON RENAL): NONE SEEN /HPF (ref 0–10)
RBC, UA: NONE SEEN /hpf (ref 0–2)
WBC, UA: NONE SEEN /hpf (ref 0–5)

## 2019-01-07 MED ORDER — LIDOCAINE HCL URETHRAL/MUCOSAL 2 % EX GEL
1.0000 "application " | Freq: Once | CUTANEOUS | Status: AC
  Administered 2019-01-07: 1 via URETHRAL

## 2019-01-07 NOTE — Progress Notes (Signed)
   01/07/2019  CC:  Chief Complaint  Patient presents with  . Cysto   HPI: Dakota Garza is a 85 yo M with a history of BPH on maximum medical management with bothersome LUTS.  -Primary interested in UroLift   There were no vitals taken for this visit. NED. A&Ox3.    Cystoscopy Procedure Note  Patient identification was confirmed, informed consent was obtained, and patient was prepped using Betadine solution.  Lidocaine jelly was administered per urethral meatus.    Pre-Procedure: - Inspection reveals a normal caliber ureteral meatus.  Procedure: The flexible cystoscope was introduced without difficulty - No urethral strictures/lesions are present. -Touching lateral lobes mid/distal prostate.  Proximal prostate bladder neck open. - No median lobe - Bilateral ureteral orifices identified - Bladder mucosa  reveals no ulcers, tumors, or lesions - No bladder stones - No trabeculation  Retroflexion shows no abnormalities  Post-Procedure: - Patient tolerated the procedure well  Transrectal ultrasound prostate: Prostate volume 49 g Prostate with 4.72 cm Enlarged TZ No PZ echogenic abnormalities  Assessment/ Plan: 1. BPH with LUTS -Pt is a good candidate for UroLift and the benefits and complications were discussed with pt. The most common side effects of urinary frequency, urgency, dysuria for several weeks were discussed.  It was stressed there is no guarantee that this procedure will resolve his symptoms.  He indicated all questions were answered and desires to schedule.   Dolores Frame, am acting as a Education administrator for Dr. Nicki Reaper C. Amear Strojny,  I, Abbie Sons, MD, have reviewed all documentation for this visit. The documentation on 01/07/19 for the exam, diagnosis, procedures, and orders are all accurate and complete.

## 2019-01-09 ENCOUNTER — Telehealth: Payer: Self-pay | Admitting: Urology

## 2019-01-09 NOTE — Telephone Encounter (Signed)
Discussed over the phone with patient  the [facility] Information form below as well as the Instructions for Pre-Admission Testing.   [place], [address] [place], [place] [address] Telephone: [phone] Fax: [phone]   Thank you for choosing [facility] for your upcoming surgery!  We are always here to assist in your urological needs.  Please read the following information with specific details for your upcoming appointments related to your surgery. Please contact Amy at [phone] Option 3 with any questions.  The Name of Your Surgery: Urolift Your Surgery Date: 02/24/19 Your Surgeon: John Giovanni  Please call Same Day Surgery at [phone] between the hours of 1pm-3pm one day prior to your surgery. They will inform you of the time to arrive at Same Day Surgery which is located on the second floor of the [facility].   Please refer to the attached letter regarding instructions for Pre-Admission Testing. You will receive a call from the [place] office regarding your appointment with them.  The Pre-Admission Testing office is located at [place], on the first floor of the [place] at [facility] in [place] (office is to the right as you enter through the [employer] of the [facility]). Please have all medications you are currently taking and your insurance card available.   Patient was advised to have nothing to eat or drink after midnight the night prior to surgery except that he may have only water until 2 hours before surgery with nothing to drink within 2 hours of surgery.  The patient states he currently takes aspirin 81 mg daily  & was informed to hold medication for 7 days prior to surgery beginning on *02/17/19**. Patient's questions were answered and he expressed understanding of these  instructions.

## 2019-01-12 ENCOUNTER — Other Ambulatory Visit: Payer: Self-pay | Admitting: Radiology

## 2019-01-12 DIAGNOSIS — N401 Enlarged prostate with lower urinary tract symptoms: Principal | ICD-10-CM

## 2019-01-12 DIAGNOSIS — N138 Other obstructive and reflux uropathy: Secondary | ICD-10-CM

## 2019-02-10 ENCOUNTER — Encounter
Admission: RE | Admit: 2019-02-10 | Discharge: 2019-02-10 | Disposition: A | Discharge status: Home / Self Care | Payer: Medicare Other | Source: Ambulatory Visit | Attending: Urology | Admitting: Urology

## 2019-02-10 ENCOUNTER — Other Ambulatory Visit: Payer: Self-pay

## 2019-02-10 DIAGNOSIS — Z01818 Encounter for other preprocedural examination: Secondary | ICD-10-CM | POA: Insufficient documentation

## 2019-02-10 DIAGNOSIS — J45909 Unspecified asthma, uncomplicated: Secondary | ICD-10-CM | POA: Insufficient documentation

## 2019-02-10 HISTORY — DX: Hypothyroidism, unspecified: E03.9

## 2019-02-10 NOTE — Patient Instructions (Addendum)
  Your procedure is scheduled on: Tuesday February 24, 2019  Report to Same Day Surgery 2nd floor [facility] Entrance-take elevator on left to 2nd floor.  Check in with surgery information desk.) To find out your arrival time, call [phone] 1:00-3:00 PM on Monday February 23, 2019  Remember: Instructions that are not followed completely may result in serious medical risk, up to and including death, or upon the discretion of your surgeon and anesthesiologist your surgery may need to be rescheduled.    __x__ 1. Do not eat food (including mints, candies, chewing gum) after midnight the night before your procedure. You may drink clear liquids up to 2 hours before you are scheduled to arrive at the hospital for your procedure.  Do not drink anything within 2 hours of your scheduled arrival to the hospital.  Approved clear liquids:  --Water or Apple juice without pulp  --Clear carbohydrate beverage such as Gatorade or Powerade  --Black Coffee or Clear Tea (No milk, no creamers, do not add anything to the coffee or tea)    __x__ 2. No Alcohol for 24 hours before or after surgery.   __x__ 3. No Smoking or e-cigarettes for 24 hours before surgery.  Do not use any chewable tobacco products for at least 6 hours before surgery.   __x__ 4. Notify your doctor if there is any change in your medical condition (cold, fever, infections).   __x__ 5. On the morning of surgery brush your teeth with toothpaste and water.  You may rinse your mouth with mouthwash if you wish.  Do not swallow any toothpaste or mouthwash.   __x__ Use antibacterial soap such as Dial to shower/bathe on the day of surgery.   Do not wear jewelry on the day of surgery.  Do not wear lotions, powders, deodorant, or perfumes.   Do not shave below the face/neck 48 hours prior to surgery.   Do not bring valuables to the hospital.    [facility] is not responsible for any belongings or valuables.    Contacts, dentures or bridgework may not be worn into surgery.  For patients discharged on the day of surgery, you will NOT be permitted to drive yourself home.  You must have a responsible adult with you for 24 hours after surgery.    __x__ Take these medicines on the morning of surgery with a SMALL SIP OF WATER:  1. Finasteride  2. Levothyroxine  3. Metoprolol  4. Gabapentin  __x__ Use inhalers on the day of surgery and bring them with you to the hospital.  __x__ Follow recommendations from Cardiologist, Pulmonologist or PCP regarding stopping blood thinners such as Coumadin, Plavix, Eliquis, Effient, Pradaxa, and Pletal.  __x__ 7 days prior to surgery: Stop Anti-inflammatories such as Advil, Ibuprofen, Motrin, Aleve, Naproxen, Naprosyn, BC/Goodies powders or aspirin products. You may continue to take Tylenol and Celebrex.   __x__ 7 days prior to surgery: Stop (CoQ10) supplements until after surgery. You may continue to take Vitamin D, Vitamin B, and multivitamin.

## 2019-02-11 NOTE — Pre-Procedure Instructions (Signed)
EKG OK BY DR KARENZ 

## 2019-02-12 ENCOUNTER — Other Ambulatory Visit: Payer: Self-pay | Admitting: Urology

## 2019-02-12 ENCOUNTER — Telehealth: Payer: Self-pay

## 2019-02-12 LAB — URINE CULTURE: Culture: 20000 — AB

## 2019-02-12 MED ORDER — SULFAMETHOXAZOLE-TRIMETHOPRIM 800-160 MG PO TABS: 1.0000 | ORAL_TABLET | Freq: Two times a day (BID) | ORAL | 0 refills | Status: DC

## 2019-02-12 NOTE — Telephone Encounter (Signed)
-----   Message from Abbie Sons, MD sent at 02/12/2019  2:53 PM EST ----- Preoperative urine culture did grow a low level of bacteria.  Antibiotic Rx was sent to pharmacy.

## 2019-02-12 NOTE — Telephone Encounter (Signed)
Patient notified

## 2019-02-24 ENCOUNTER — Other Ambulatory Visit: Payer: Self-pay

## 2019-02-24 ENCOUNTER — Encounter: Payer: Self-pay | Admitting: *Deleted

## 2019-02-24 ENCOUNTER — Ambulatory Visit: Payer: Medicare Other | Admitting: Anesthesiology

## 2019-02-24 ENCOUNTER — Encounter
Admission: RE | Disposition: A | Discharge status: Home / Self Care | Payer: Self-pay | Source: Ambulatory Visit | Attending: Urology

## 2019-02-24 ENCOUNTER — Ambulatory Visit
Admission: RE | Admit: 2019-02-24 | Discharge: 2019-02-24 | Disposition: A | Discharge status: Home / Self Care | Payer: Medicare Other | Source: Ambulatory Visit | Attending: Urology | Admitting: Urology

## 2019-02-24 DIAGNOSIS — E039 Hypothyroidism, unspecified: Secondary | ICD-10-CM | POA: Diagnosis not present

## 2019-02-24 DIAGNOSIS — H409 Unspecified glaucoma: Secondary | ICD-10-CM | POA: Diagnosis not present

## 2019-02-24 DIAGNOSIS — Z7982 Long term (current) use of aspirin: Secondary | ICD-10-CM | POA: Diagnosis not present

## 2019-02-24 DIAGNOSIS — Z8249 Family history of ischemic heart disease and other diseases of the circulatory system: Secondary | ICD-10-CM | POA: Insufficient documentation

## 2019-02-24 DIAGNOSIS — N401 Enlarged prostate with lower urinary tract symptoms: Secondary | ICD-10-CM | POA: Insufficient documentation

## 2019-02-24 DIAGNOSIS — Z79899 Other long term (current) drug therapy: Secondary | ICD-10-CM | POA: Diagnosis not present

## 2019-02-24 DIAGNOSIS — J45909 Unspecified asthma, uncomplicated: Secondary | ICD-10-CM | POA: Insufficient documentation

## 2019-02-24 DIAGNOSIS — Z820 Family history of epilepsy and other diseases of the nervous system: Secondary | ICD-10-CM | POA: Diagnosis not present

## 2019-02-24 DIAGNOSIS — N138 Other obstructive and reflux uropathy: Secondary | ICD-10-CM

## 2019-02-24 DIAGNOSIS — Z85819 Personal history of malignant neoplasm of unspecified site of lip, oral cavity, and pharynx: Secondary | ICD-10-CM | POA: Insufficient documentation

## 2019-02-24 DIAGNOSIS — K219 Gastro-esophageal reflux disease without esophagitis: Secondary | ICD-10-CM | POA: Insufficient documentation

## 2019-02-24 HISTORY — PX: CYSTOSCOPY WITH INSERTION OF UROLIFT: SHX6678

## 2019-02-24 SURGERY — CYSTOSCOPY WITH INSERTION OF UROLIFT
Anesthesia: General | Site: Prostate

## 2019-02-24 MED ORDER — EPHEDRINE SULFATE 50 MG/ML IJ SOLN
INTRAMUSCULAR | Status: DC | PRN
  Administered 2019-02-24: 10 mg via INTRAVENOUS

## 2019-02-24 MED ORDER — PROPOFOL 500 MG/50ML IV EMUL
INTRAVENOUS | Status: DC | PRN
  Administered 2019-02-24: 150 ug/kg/min via INTRAVENOUS
  Administered 2019-02-24: 25 ug/kg/min via INTRAVENOUS

## 2019-02-24 MED ORDER — FENTANYL CITRATE (PF) 100 MCG/2ML IJ SOLN
INTRAMUSCULAR | Status: AC
  Filled 2019-02-24: qty 2

## 2019-02-24 MED ORDER — EPHEDRINE SULFATE 50 MG/ML IJ SOLN
INTRAMUSCULAR | Status: AC
  Filled 2019-02-24: qty 1

## 2019-02-24 MED ORDER — FENTANYL CITRATE (PF) 100 MCG/2ML IJ SOLN
25.0000 ug | INTRAMUSCULAR | Status: DC | PRN
  Administered 2019-02-24: 25 ug via INTRAVENOUS

## 2019-02-24 MED ORDER — LIDOCAINE HCL URETHRAL/MUCOSAL 2 % EX GEL
CUTANEOUS | Status: DC | PRN
  Administered 2019-02-24: 1 via URETHRAL

## 2019-02-24 MED ORDER — FAMOTIDINE 20 MG PO TABS
20.0000 mg | ORAL_TABLET | Freq: Once | ORAL | Status: AC
  Administered 2019-02-24: 20 mg via ORAL

## 2019-02-24 MED ORDER — PHENYLEPHRINE HCL 10 MG/ML IJ SOLN
INTRAMUSCULAR | Status: AC
  Filled 2019-02-24: qty 1

## 2019-02-24 MED ORDER — ONDANSETRON HCL 4 MG/2ML IJ SOLN: 4.0000 mg | Freq: Once | INTRAMUSCULAR | Status: DC | PRN

## 2019-02-24 MED ORDER — PROPOFOL 500 MG/50ML IV EMUL
INTRAVENOUS | Status: AC
  Filled 2019-02-24: qty 50

## 2019-02-24 MED ORDER — CEFAZOLIN SODIUM-DEXTROSE 2-4 GM/100ML-% IV SOLN
INTRAVENOUS | Status: AC
  Filled 2019-02-24: qty 100

## 2019-02-24 MED ORDER — MIDAZOLAM HCL 2 MG/2ML IJ SOLN
INTRAMUSCULAR | Status: AC
  Filled 2019-02-24: qty 2

## 2019-02-24 MED ORDER — LACTATED RINGERS IV SOLN: INTRAVENOUS | Status: DC

## 2019-02-24 MED ORDER — CEFAZOLIN SODIUM-DEXTROSE 2-4 GM/100ML-% IV SOLN
2.0000 g | INTRAVENOUS | Status: AC
  Administered 2019-02-24: 2 g via INTRAVENOUS

## 2019-02-24 MED ORDER — FAMOTIDINE 20 MG PO TABS
ORAL_TABLET | ORAL | Status: AC
  Administered 2019-02-24: 20 mg via ORAL
  Filled 2019-02-24: qty 1

## 2019-02-24 MED ORDER — PHENYLEPHRINE HCL 10 MG/ML IJ SOLN
INTRAMUSCULAR | Status: DC | PRN
  Administered 2019-02-24: 100 ug via INTRAVENOUS

## 2019-02-24 MED ORDER — PROPOFOL 10 MG/ML IV BOLUS
INTRAVENOUS | Status: DC | PRN
  Administered 2019-02-24: 15 mg via INTRAVENOUS
  Administered 2019-02-24: 20 mg via INTRAVENOUS

## 2019-02-24 SURGICAL SUPPLY — 15 items
BAG DRAIN CYSTO-URO LG1000N (MISCELLANEOUS) ×2 IMPLANT
BRUSH SCRUB EZ  4% CHG (MISCELLANEOUS) ×1
BRUSH SCRUB EZ 4% CHG (MISCELLANEOUS) ×1 IMPLANT
GLOVE BIO SURGEON STRL SZ8 (GLOVE) ×2 IMPLANT
GOWN STRL REUS W/ TWL LRG LVL3 (GOWN DISPOSABLE) ×1 IMPLANT
GOWN STRL REUS W/ TWL XL LVL3 (GOWN DISPOSABLE) ×1 IMPLANT
GOWN STRL REUS W/TWL LRG LVL3 (GOWN DISPOSABLE) ×1
GOWN STRL REUS W/TWL XL LVL3 (GOWN DISPOSABLE) ×1
KIT TURNOVER CYSTO (KITS) ×2 IMPLANT
PACK CYSTO AR (MISCELLANEOUS) ×2 IMPLANT
SET CYSTO W/LG BORE CLAMP LF (SET/KITS/TRAYS/PACK) ×2 IMPLANT
SURGILUBE 2OZ TUBE FLIPTOP (MISCELLANEOUS) ×2 IMPLANT
SYSTEM UROLIFT (Male Continence) ×6 IMPLANT
WATER STERILE IRR 1000ML POUR (IV SOLUTION) ×2 IMPLANT
WATER STERILE IRR 3000ML UROMA (IV SOLUTION) ×2 IMPLANT

## 2019-02-24 NOTE — Anesthesia Post-op Follow-up Note (Signed)
Anesthesia QCDR form completed.        

## 2019-02-24 NOTE — Op Note (Signed)
Preoperative diagnosis: BPH with obstructive symptomatology  Postoperative diagnosis: BPH with obstructive symptomatology  Principal procedure: Urolift procedure, with the placement of 6 implants.  Surgeon: Bernardo Heater  Anesthesia: MAC  Complications: None  Drains: None  Estimated blood loss: < 5 mL  Indications: 85 year-old male with obstructive symptomatology secondary to BPH.  The patient's symptoms have progressed, and he has requested further management.  Cystoscopy was remarkable for lateral lobe enlargement and prostate volume by ultrasound was 49 g.  Management options including TURP with resection/ablation of the prostate as well as Urolift were discussed.  The patient has chosen to have a Urolift procedure.  He has been instructed to the procedure as well as risks and complications which include but are not limited to infection, bleeding, and inadequate treatment with the Urolift procedure alone, anesthetic complications, among others.  He understands these and desires to proceed.  Findings: Using the 17 French cystoscope, urethra and bladder were inspected.  There were no urethral lesions.  Prostatic urethra was obstructed secondary to bilobar hypertrophy.  The bladder was inspected circumferentially.  This revealed normal findings.  Description of procedure: The patient was properly identified in the holding area.  He received preoperative IV antibiotics.  He was taken to the operating room where sedation was obtained by anesthesia.Marland Kitchen  He is placed in the dorsolithotomy position.  Genitalia and perineum were prepped and draped.  Proper timeout was performed.  A 15F cystoscope was inserted into the bladder. The cystoscopy bridge was replaced with a UroLift delivery device.The first treatment site was the patient's right side approximately 1.5cm distal to the bladder neck. The distal tip of the delivery device was then angled laterally approximately 20 degrees at this position to compress  the lateral lobe.  The trigger was pulled, thereby deploying a needle containing the implant through the prostate.  The needle was then retracted, allowing one end of the implant to be delivered to the capsular surface of the prostate.  The implant was then tensioned to assure capsular seating and removal of slack monofilament.  The device was then angled back toward midline and slowly advanced proximally until cystoscopic verification of the monofilament being centered in the delivery bay. The urethral end piece was then affixed to the monofilament thereby tailoring the size of the implant.  Excess filament was then severed.  The delivery device was then re-advanced into the bladder. The delivery device was then replaced with cystoscope and bridge and the implant location and opening effect was confirmed cystoscopically.  The same procedure was then repeated on the left side, and 2 additional implants were delivered just proximal to the verumontanum, again one on right and one on left side of the prostate, following the same technique.  2 additional implants were deployed in the mid prostate.   A final cystoscopy was conducted first to inspect the location and state of each implant and second, to confirm the presence of a continuous anterior channel was present through the prostatic urethra with irrigation flow turned off. Six mplants were delivered in total.   Following this, the scope was removed and he was transported to the PACU in stable condition.  He tolerated the procedure well.   Garlan Drewes [employer]. MD

## 2019-02-24 NOTE — Anesthesia Postprocedure Evaluation (Signed)
Anesthesia Post Note  Patient: Christan Defranco  Procedure(s) Performed: CYSTOSCOPY WITH INSERTION OF UROLIFT (N/A Prostate)  Patient location during evaluation: PACU Anesthesia Type: General Level of consciousness: awake and alert and oriented Pain management: pain level controlled Vital Signs Assessment: post-procedure vital signs reviewed and stable Respiratory status: spontaneous breathing Cardiovascular status: blood pressure returned to baseline Anesthetic complications: no     Last Vitals:  Vitals:   02/24/19 0943 02/24/19 0952  BP: 109/62 (!) 126/54  Pulse: 63 62  Resp: 16 18  Temp: 36.5 C (!) 36.4 C  SpO2: 96% 98%    Last Pain:  Vitals:   02/24/19 0952  TempSrc: Temporal  PainSc: 0-No pain                 Karem Farha

## 2019-02-24 NOTE — Anesthesia Procedure Notes (Signed)
Date/Time: 02/24/2019 8:30 AM Performed by: Allean Found, CRNA Pre-anesthesia Checklist: Patient identified, Emergency Drugs available, Suction available, Patient being monitored and Timeout performed Patient Re-evaluated:Patient Re-evaluated prior to induction Oxygen Delivery Method: Nasal cannula Placement Confirmation: positive ETCO2

## 2019-02-24 NOTE — Transfer of Care (Signed)
Immediate Anesthesia Transfer of Care Note  Patient: Dakota Garza  Procedure(s) Performed: CYSTOSCOPY WITH INSERTION OF UROLIFT (N/A Prostate)  Patient Location: PACU  Anesthesia Type:General  Level of Consciousness: awake  Airway & Oxygen Therapy: Patient Spontanous Breathing and Patient connected to nasal cannula oxygen  Post-op Assessment: Report given to RN and Post -op Vital signs reviewed and stable  Post vital signs: Reviewed and stable  Last Vitals:  Vitals Value Taken Time  BP 107/60 02/24/2019  9:12 AM  Temp    Pulse 73 02/24/2019  9:13 AM  Resp    SpO2 99 % 02/24/2019  9:13 AM  Vitals shown include unvalidated device data.  Last Pain:  Vitals:   02/24/19 0710  TempSrc: Tympanic  PainSc: 0-No pain         Complications: No apparent anesthesia complications

## 2019-02-24 NOTE — Anesthesia Preprocedure Evaluation (Addendum)
Anesthesia Evaluation  Patient identified by MRN, date of birth, ID band Patient awake    Reviewed: Allergy & Precautions, NPO status , Patient's Chart, lab work & pertinent test results  Airway Mallampati: II  TM Distance: >3 FB     Dental   Pulmonary asthma ,    Pulmonary exam normal        Cardiovascular negative cardio ROS Normal cardiovascular exam     Neuro/Psych negative neurological ROS  negative psych ROS   GI/Hepatic Neg liver ROS, GERD  Controlled,  Endo/Other  Hypothyroidism   Renal/GU negative Renal ROS  negative genitourinary   Musculoskeletal negative musculoskeletal ROS (+)   Abdominal Normal abdominal exam  (+)   Peds negative pediatric ROS (+)  Hematology negative hematology ROS (+)   Anesthesia Other Findings   Reproductive/Obstetrics                             Anesthesia Physical Anesthesia Plan  ASA: III  Anesthesia Plan: General   Post-op Pain Management:    Induction: Intravenous  PONV Risk Score and Plan: TIVA  Airway Management Planned: Nasal Cannula  Additional Equipment:   Intra-op Plan:   Post-operative Plan:   Informed Consent: I have reviewed the patients History and Physical, chart, labs and discussed the procedure including the risks, benefits and alternatives for the proposed anesthesia with the patient or authorized representative who has indicated his/her understanding and acceptance.     Dental advisory given  Plan Discussed with: CRNA and Surgeon  Anesthesia Plan Comments:        Anesthesia Quick Evaluation

## 2019-02-24 NOTE — Interval H&P Note (Signed)
History and Physical Interval Note:  02/24/2019 8:17 AM  Dakota Garza  has presented today for surgery, with the diagnosis of BPH WITH URINARY TRACT SYMPTOMS  The various methods of treatment have been discussed with the patient and family. After consideration of risks, benefits and other options for treatment, the patient has consented to  Procedure(s): CYSTOSCOPY WITH INSERTION OF UROLIFT (N/A) as a surgical intervention .  The patient's history has been reviewed, patient examined, no change in status, stable for surgery.  I have reviewed the patient's chart and labs.  Questions were answered to the patient's satisfaction.     [place]

## 2019-02-24 NOTE — Discharge Instructions (Signed)
Cystoscopy  Cystoscopy is a procedure that is used to help diagnose and sometimes treat conditions that affect that lower urinary tract. The lower urinary tract includes the bladder and the tube that drains urine from the bladder out of the body (urethra). Cystoscopy is performed with a thin, tube-shaped instrument with a light and camera at the end (cystoscope). The cystoscope may be hard (rigid) or flexible, depending on the goal of the procedure.The cystoscope is inserted through the urethra, into the bladder. Cystoscopy may be recommended if you have:  Urinary tractinfections that keep coming back (recurring).  Blood in the urine (hematuria).  Loss of bladder control (urinary incontinence) or an overactive bladder.  Unusual cells found in a urine sample.  A blockage in the urethra.  Painful urination.  An abnormality in the bladder found during an intravenous pyelogram (IVP) or CT scan. Cystoscopy may also be done to remove a sample of tissue to be examined under a microscope (biopsy). Tell a health care provider about:  Any allergies you have.  All medicines you are taking, including vitamins, herbs, eye drops, creams, and over-the-counter medicines.  Any problems you or family members have had with anesthetic medicines.  Any blood disorders you have.  Any surgeries you have had.  Any medical conditions you have.  Whether you are pregnant or may be pregnant. What are the risks? Generally, this is a safe procedure. However, problems may occur, including:  Infection.  Bleeding.  Allergic reactions to medicines.  Damage to other structures or organs. What happens before the procedure?  Ask your health care provider about: ? Changing or stopping your regular medicines. This is especially important if you are taking diabetes medicines or blood thinners. ? Taking medicines such as aspirin and ibuprofen. These medicines can thin your blood. Do not take these medicines  before your procedure if your health care provider instructs you not to.  Follow instructions from your health care provider about eating or drinking restrictions.  You may be given antibiotic medicine to help prevent infection.  You may have an exam or testing, such as X-rays of the bladder, urethra, or kidneys.  You may have urine tests to check for signs of infection.  Plan to have someone take you home after the procedure. What happens during the procedure?  To reduce your risk of infection,your health care team will wash or sanitize their hands.  You will be given one or more of the following: ? A medicine to help you relax (sedative). ? A medicine to numb the area (local anesthetic).  The area around the opening of your urethra will be cleaned.  The cystoscope will be passed through your urethra into your bladder.  Germ-free (sterile)fluid will flow through the cystoscope to fill your bladder. The fluid will stretch your bladder so that your surgeon can clearly examine your bladder walls.  The cystoscope will be removed and your bladder will be emptied. The procedure may vary among health care providers and hospitals. What happens after the procedure?  You may have some soreness or pain in your abdomen and urethra. Medicines will be available to help you.  You may have some blood in your urine.  Do not drive for 24 hours if you received a sedative. This information is not intended to replace advice given to you by your health care provider. Make sure you discuss any questions you have with your health care provider. Document Released: 12/14/2000 Document Revised: 09/27/2017 Document Reviewed: 11/03/2015 Elsevier Interactive Patient  Education  2019 [place]   1) The drugs that you were given will stay in your system until tomorrow so for the next 24 hours you should not:  A) Drive an automobile B) Make any legal  decisions C) Drink any alcoholic beverage   2) You may resume regular meals tomorrow.  Today it is better to start with liquids and gradually work up to solid foods.  You may eat anything you prefer, but it is better to start with liquids, then soup and crackers, and gradually work up to solid foods.   3) Please notify your doctor immediately if you have any unusual bleeding, trouble breathing, redness and pain at the surgery site, drainage, fever, or pain not relieved by medication.    4) Additional Instructions:        Please contact your physician with any problems or Same Day Surgery at [phone], Monday through Friday 6 am to 4 pm, or [place] at [facility] number at [phone].

## 2019-02-24 NOTE — H&P (Signed)
02/24/2019 7:28 AM   Dakota Garza [birth year] [id number]  Referring provider: No referring provider defined for this encounter.   HPI: 85 year old male with moderate to severe lower urinary tract symptoms.  Status post PVP in 2012 however presents with recurrent voiding symptoms over the last 2 years.  He is on combination therapy with tamsulosin and finasteride.  IPSS is 20/35.  Cystoscopy showed lateral lobe enlargement without intravesical median lobe.  Prostate volume by ultrasound was 49 g.  He has elected to proceed with UroLift.   PMH: Past Medical History:  Diagnosis Date  . Asthma in adult without complication   . BPH (benign prostatic hyperplasia)   . Glaucoma   . Hypothyroidism   . Oral cancer [facility])     Surgical History: Past Surgical History:  Procedure Laterality Date  . Oral cancer Surgery  2012  . POSTERIOR FUSION SPINAL DEFORMITY  2015  . prostate vaporization  2012    Home Medications:    albuterol 108 (90 Base) MCG/ACT inhaler Commonly known as:  PROVENTIL HFA;VENTOLIN HFA Inhale 2 puffs into the lungs every 6 (six) hours as needed for wheezing or shortness of breath.   aspirin EC 81 MG tablet Take 81 mg by mouth daily.   budesonide-formoterol 160-4.5 MCG/ACT inhaler Commonly known as:  SYMBICORT Inhale 2 puffs into the lungs 2 (two) times daily.   cetirizine 10 MG tablet Commonly known as:  ZYRTEC Take 10 mg by mouth as needed for allergies.   CoQ10 100 MG Caps Take 100 mg by mouth daily.   finasteride 5 MG tablet Commonly known as:  PROSCAR Take 1 tablet (5 mg total) by mouth daily.   ipratropium 0.03 % nasal spray Commonly known as:  ATROVENT Place 2 sprays into both nostrils every 12 (twelve) hours. Reported on 01/30/2016   levothyroxine 50 MCG tablet Commonly known as:  SYNTHROID, LEVOTHROID Take 50 mcg by mouth daily before breakfast.   metoprolol tartrate 25 MG tablet Commonly known as:  LOPRESSOR Take 25 mg by mouth 2  (two) times daily.   montelukast 10 MG tablet Commonly known as:  SINGULAIR Take 10 mg by mouth at bedtime.   multivitamin tablet Take 1 tablet by mouth daily.   pantoprazole 40 MG tablet Commonly known as:  PROTONIX Take by mouth.   polyethylene glycol packet Commonly known as:  MIRALAX / GLYCOLAX Take 17 g by mouth daily.   pravastatin 40 MG tablet Commonly known as:  PRAVACHOL Take 40 mg by mouth daily.   PRESERVISION AREDS PO Take 1 tablet by mouth daily.   PROBIOTIC PO Take 1 tablet by mouth daily.   tamsulosin 0.4 MG Caps capsule Commonly known as:  FLOMAX Take 1 capsule (0.4 mg total) by mouth daily.    Allergies: No Known Allergies  Family History: Family History  Problem Relation Age of Onset  . Coronary artery disease Mother   . Alzheimer's disease Father   . Coronary artery disease Sister   . Kidney disease Neg Hx   . Prostate cancer Neg Hx     Social History:  reports that he has never smoked. He has never used smokeless tobacco. He reports current alcohol use. He reports that he does not use drugs.  ROS: No significant changes from 11/21/2018  Physical Exam: BP 119/73   Pulse 73   Temp 97.8 F (36.6 C) (Tympanic)   Resp 16   Ht 5\' 9"  (1.753 m)   Wt 84.4 kg   SpO2 96%  BMI 27.48 kg/m   Constitutional:  Alert and oriented, No acute distress. HEENT: [place] AT, moist mucus membranes.  Trachea midline, no masses. Cardiovascular: No clubbing, cyanosis, or edema.  RRR Respiratory: Normal respiratory effort, no increased work of breathing.  Clear GI: Abdomen is soft, nontender, nondistended, no abdominal masses GU: No CVA tenderness Lymph: No cervical or inguinal lymphadenopathy. Skin: No rashes, bruises or suspicious lesions. Neurologic: Grossly intact, no focal deficits, moving all 4 extremities. Psychiatric: Normal mood and affect.   Assessment & Plan:   85 year old male with BPH and moderate to severe lower urinary tract symptoms  who presents for UroLift.  The procedure has been discussed in detail including potential risks of bleeding, irritative symptoms and persistent voiding symptoms.  He desires to proceed.  Abbie Sons, [place] [address] [place], [place] [address] [phone]

## 2019-03-09 ENCOUNTER — Telehealth: Payer: Self-pay | Admitting: Urology

## 2019-03-09 NOTE — Telephone Encounter (Signed)
Pt called and states that he would like to be seen before his next appt (post op Follow up) 03/25/2019 not related to this problem) One of his testicles is swollen, he feels like it has a bump in it, He would like a call back if possible. Please advise.

## 2019-03-13 ENCOUNTER — Telehealth: Payer: Self-pay | Admitting: Urology

## 2019-03-13 NOTE — Telephone Encounter (Signed)
ERROR

## 2019-03-13 NOTE — Telephone Encounter (Signed)
I called pt to schedule appt, he states that he will talk to Dr Bernardo Heater about it when he comes in on 03/25/2019

## 2019-03-25 ENCOUNTER — Ambulatory Visit: Payer: Medicare Other | Admitting: Urology

## 2019-05-04 ENCOUNTER — Ambulatory Visit: Payer: Self-pay | Admitting: Urology

## 2019-05-06 ENCOUNTER — Telehealth: Payer: Self-pay | Admitting: Urology

## 2019-06-01 ENCOUNTER — Other Ambulatory Visit: Payer: Self-pay

## 2019-06-01 ENCOUNTER — Ambulatory Visit (INDEPENDENT_AMBULATORY_CARE_PROVIDER_SITE_OTHER): Payer: Medicare Other | Admitting: Urology

## 2019-06-01 ENCOUNTER — Encounter: Payer: Self-pay | Admitting: Urology

## 2019-06-01 VITALS — BP 137/70 | HR 82 | Ht 69.0 in | Wt 187.1 lb

## 2019-06-01 DIAGNOSIS — N138 Other obstructive and reflux uropathy: Secondary | ICD-10-CM | POA: Diagnosis not present

## 2019-06-01 DIAGNOSIS — N401 Enlarged prostate with lower urinary tract symptoms: Secondary | ICD-10-CM | POA: Diagnosis not present

## 2019-06-01 LAB — BLADDER SCAN AMB NON-IMAGING

## 2019-06-01 MED ORDER — FINASTERIDE 5 MG PO TABS: 5.0000 mg | ORAL_TABLET | Freq: Every day | ORAL | 3 refills | Status: AC

## 2019-06-01 MED ORDER — TAMSULOSIN HCL 0.4 MG PO CAPS: 0.4000 mg | ORAL_CAPSULE | Freq: Every day | ORAL | 3 refills | Status: DC

## 2019-06-01 NOTE — Progress Notes (Signed)
06/01/2019 10:24 AM   Dakota Garza 07/09/1923 [id number]  Referring provider: Kirk Ruths, MD [facility] [place], [place] [address]  Chief Complaint  Patient presents with  . Follow-up    HPI: 85 year old male with a history of BPH and lower urinary tract symptoms.  He had prior PVP in 2012 and had worsening voiding symptoms.  He elected UroLift which was performed on 02/24/2019 and has not been able to follow-up due to COVID-19 pandemic.  Preop IPSS was 20/4.  He has noted moderate improvement after the procedure but was hoping he would have better symptomatic improvement.  He does complain of urinary hesitancy and decreased force and caliber of his urinary stream.  IPSS completed today was 10/3.  PVR was 45 mL.  Denies dysuria or gross hematuria.  He does note occasional left testicular discomfort and wanted to have this checked.   PMH: Past Medical History:  Diagnosis Date  . Asthma in adult without complication   . BPH (benign prostatic hyperplasia)   . Glaucoma   . Hypothyroidism   . Oral cancer [facility])     Surgical History: Past Surgical History:  Procedure Laterality Date  . CYSTOSCOPY WITH INSERTION OF UROLIFT N/A 02/24/2019   Procedure: CYSTOSCOPY WITH INSERTION OF UROLIFT;  Surgeon: Abbie Sons, MD;  Location: [facility] ORS;  Service: Urology;  Laterality: N/A;  . Oral cancer Surgery  2012  . POSTERIOR FUSION SPINAL DEFORMITY  2015  . prostate vaporization  2012    Home Medications:  Allergies as of 06/01/2019   No Known Allergies     Medication List       Accurate as of June 01, 2019 10:24 AM. If you have any questions, ask your nurse or doctor.        albuterol 108 (90 Base) MCG/ACT inhaler Commonly known as:  VENTOLIN HFA Inhale 2 puffs into the lungs every 6 (six) hours as needed for wheezing or shortness of breath.   ARTIFICIAL TEARS OP Place 1-2 drops into both eyes daily.   aspirin EC 81 MG tablet Take 81  mg by mouth daily.   budesonide-formoterol 160-4.5 MCG/ACT inhaler Commonly known as:  SYMBICORT Inhale 2 puffs into the lungs 2 (two) times daily.   CoQ10 100 MG Caps Take 100 mg by mouth daily.   gabapentin 100 MG capsule Commonly known as:  NEURONTIN Take 100 mg by mouth 2 (two) times daily.   ipratropium 0.03 % nasal spray Commonly known as:  ATROVENT Place 1 spray into both nostrils 2 (two) times daily.   levothyroxine 75 MCG tablet Commonly known as:  SYNTHROID Take 75 mcg by mouth daily before breakfast.   metoprolol tartrate 25 MG tablet Commonly known as:  LOPRESSOR Take 12.5 mg by mouth 2 (two) times daily.   montelukast 10 MG tablet Commonly known as:  SINGULAIR Take 10 mg by mouth at bedtime.   multivitamin tablet Take 1 tablet by mouth daily.   polyethylene glycol 17 g packet Commonly known as:  MIRALAX / GLYCOLAX Take 17 g by mouth daily as needed for moderate constipation.   pravastatin 40 MG tablet Commonly known as:  PRAVACHOL Take 40 mg by mouth every evening.   PRESERVISION AREDS 2 PO Take 1 capsule by mouth daily.   PROBIOTIC PO Take 1 tablet by mouth daily.   tamsulosin 0.4 MG Caps capsule Commonly known as:  FLOMAX Take 1 capsule (0.4 mg total) by mouth daily. What changed:  when to take this       Allergies: No Known Allergies  Family History: Family History  Problem Relation Age of Onset  . Coronary artery disease Mother   . Alzheimer's disease Father   . Coronary artery disease Sister   . Kidney disease Neg Hx   . Prostate cancer Neg Hx     Social History:  reports that he has never smoked. He has never used smokeless tobacco. He reports current alcohol use. He reports that he does not use drugs.  ROS: UROLOGY Frequent Urination?: No Hard to postpone urination?: No Burning/pain with urination?: No Get up at night to urinate?: Yes Leakage of urine?: No Urine stream starts and stops?: No Trouble starting stream?: Yes  Do you have to strain to urinate?: No Blood in urine?: No Urinary tract infection?: No Sexually transmitted disease?: No Injury to kidneys or bladder?: No Painful intercourse?: No Weak stream?: Yes Erection problems?: No Penile pain?: No  Gastrointestinal Nausea?: No Vomiting?: No Indigestion/heartburn?: No Diarrhea?: No Constipation?: Yes  Constitutional Fever: No Night sweats?: No Weight loss?: No Fatigue?: No  Skin Skin rash/lesions?: No Itching?: No  Eyes Blurred vision?: Yes Double vision?: No  Ears/Nose/Throat Sore throat?: No Sinus problems?: No  Hematologic/Lymphatic Swollen glands?: No Easy bruising?: Yes  Cardiovascular Leg swelling?: No Chest pain?: No  Respiratory Cough?: No Shortness of breath?: No  Endocrine Excessive thirst?: No  Musculoskeletal Back pain?: Yes Joint pain?: No  Neurological Headaches?: No Dizziness?: Yes  Psychologic Depression?: No Anxiety?: No  Physical Exam: There were no vitals taken for this visit.  Constitutional:  Alert and oriented, No acute distress. HEENT: [place] AT, moist mucus membranes.  Trachea midline, no masses. Cardiovascular: No clubbing, cyanosis, or edema. Respiratory: Normal respiratory effort, no increased work of breathing. GI: Abdomen is soft, nontender, nondistended, no abdominal masses GU: No CVA tenderness.  Testes descended bilaterally.  Left testis is atrophic.  There is mild tenderness of the left epididymis. Lymph: No cervical or inguinal lymphadenopathy. Skin: No rashes, bruises or suspicious lesions. Neurologic: Grossly intact, no focal deficits, moving all 4 extremities. Psychiatric: Normal mood and affect.   Assessment & Plan:    1. BPH with obstruction/lower urinary tract symptoms Moderate improvement after UroLift.  He has restarted his tamsulosin and finasteride.  I discussed cystoscopy and other surgical options including TURP or placement of additional UroLift implants.   He is not sure he wants to pursue further surgical management.  He would like to think this over and I would recommend urodynamics prior to TURP.  Tamsulosin and finasteride were refilled.  Follow-up 6 months and if he desires to pursue additional surgical management will schedule urodynamics.   Return in about 6 months (around 12/01/2019) for Recheck.  Abbie Sons, [place] [address] [place], [place] [address] [phone]

## 2019-07-21 ENCOUNTER — Telehealth: Payer: Self-pay | Admitting: Urology

## 2019-07-21 NOTE — Telephone Encounter (Signed)
Pt. Called and made an appointment to come in and discuss his options for future surgery or urodynamics. Pt is scheduled for 6 month follow up in December 2020 but he states he does not want to wait until then to discuss. Pt. States if Dr. Bernardo Heater wants to call him and discuss these options on the phone then we can cancel the 08/20/19 appt.

## 2019-07-30 DIAGNOSIS — M48062 Spinal stenosis, lumbar region with neurogenic claudication: Secondary | ICD-10-CM | POA: Insufficient documentation

## 2019-08-07 ENCOUNTER — Telehealth (INDEPENDENT_AMBULATORY_CARE_PROVIDER_SITE_OTHER): Payer: Medicare Other | Admitting: Urology

## 2019-08-07 ENCOUNTER — Telehealth: Payer: Self-pay | Admitting: Urology

## 2019-08-07 DIAGNOSIS — N401 Enlarged prostate with lower urinary tract symptoms: Secondary | ICD-10-CM | POA: Diagnosis not present

## 2019-08-07 NOTE — Telephone Encounter (Signed)
test

## 2019-08-07 NOTE — Telephone Encounter (Signed)
I can do a telephone visit today if he is available and if someone can put a telephone visit encounter in epic

## 2019-08-08 NOTE — Progress Notes (Signed)
Virtual Visit via Telephone Note  I connected with Dakota Garza on 08/07/19 at  1:45 PM EDT by telephone and verified that I am speaking with the correct person using two identifiers.  Location: Patient: Home Provider: Office   I discussed the limitations, risks, security and privacy concerns of performing an evaluation and management service by telephone and the availability of in person appointments. I also discussed with the patient that there may be a patient responsible charge related to this service. The patient expressed understanding and agreed to proceed.   History of Present Illness: 85 y.o. male followed for BPH.  He has a prior history of PVP in 2012 and developed worsening voiding symptoms and elected UroLift which was performed in February 2020.  At his 6 months postop follow-up he had noted moderate improvement in his voiding symptoms however still had complaints of urinary hesitancy and decreased stream.  Preop IPSS was 20/4 and 4 months postop IPSS was 10/3.  PVR was 45 mL.  He elected to restart tamsulosin and finasteride.  We discussed additional UroLift versus alternative outlet procedures.  I had recommended urodynamics if he was interested in further outlet procedures.  He was scheduled for a follow-up in December however wanted to discuss options earlier.    Observations/Objective: N/A  Assessment and Plan: Moderate improvement in lower urinary tract symptoms after UroLift.  He is potentially interested in an additional outlet procedure.  I recommended a urodynamic study prior to pursuing further surgical management.  Follow Up Instructions: He would like to schedule urodynamics.   I discussed the assessment and treatment plan with the patient. The patient was provided an opportunity to ask questions and all were answered. The patient agreed with the plan and demonstrated an understanding of the instructions.   The patient was advised to call back or seek an  in-person evaluation if the symptoms worsen or if the condition fails to improve as anticipated.  I provided 12 minutes of non-face-to-face time during this encounter.   Abbie Sons, MD

## 2019-08-20 ENCOUNTER — Ambulatory Visit: Payer: Medicare Other | Admitting: Urology

## 2019-09-30 ENCOUNTER — Other Ambulatory Visit: Payer: Self-pay | Admitting: Urology

## 2019-10-02 ENCOUNTER — Ambulatory Visit (INDEPENDENT_AMBULATORY_CARE_PROVIDER_SITE_OTHER): Payer: Medicare Other | Admitting: Urology

## 2019-10-02 ENCOUNTER — Encounter: Payer: Self-pay | Admitting: Urology

## 2019-10-02 ENCOUNTER — Other Ambulatory Visit: Payer: Self-pay

## 2019-10-02 VITALS — BP 145/73 | HR 91 | Ht 69.0 in | Wt 187.0 lb

## 2019-10-02 DIAGNOSIS — N401 Enlarged prostate with lower urinary tract symptoms: Secondary | ICD-10-CM

## 2019-10-02 DIAGNOSIS — N138 Other obstructive and reflux uropathy: Secondary | ICD-10-CM | POA: Diagnosis not present

## 2019-10-02 NOTE — Progress Notes (Signed)
10/02/2019 4:04 PM   Dakota Garza 1923/07/25 [id number]  Referring provider: Kirk Ruths, MD [facility] [place],   [address]  Chief Complaint  Patient presents with  . Results    UDS    HPI: [name] presents today for follow-up of his urodynamic study.  He has moderate lower urinary tract symptoms which have persisted after UroLift.  Symptoms include frequency, nocturia x2, intermittent urinary stream and urinary hesitancy.  PVR has been around 45 mL.  He was contemplating additional outlet procedures and I recommended urodynamic study which was performed last month.  Refer to the urodynamics report for details.  His PVR was 50 mL.  He was able to generate a sustained detrusor contraction and had decreased flow consistent with outlet obstruction.   PMH: Past Medical History:  Diagnosis Date  . Asthma in adult without complication   . BPH (benign prostatic hyperplasia)   . Glaucoma   . Hypothyroidism   . Oral cancer [facility])     Surgical History: Past Surgical History:  Procedure Laterality Date  . CYSTOSCOPY WITH INSERTION OF UROLIFT N/A 02/24/2019   Procedure: CYSTOSCOPY WITH INSERTION OF UROLIFT;  Surgeon: Abbie Sons, MD;  Location: [facility] ORS;  Service: Urology;  Laterality: N/A;  . Oral cancer Surgery  2012  . POSTERIOR FUSION SPINAL DEFORMITY  2015  . prostate vaporization  2012    Home Medications:  Allergies as of 10/02/2019   No Known Allergies     Medication List       Accurate as of October 02, 2019  4:04 PM. If you have any questions, ask your nurse or doctor.        albuterol 108 (90 Base) MCG/ACT inhaler Commonly known as: VENTOLIN HFA Inhale 2 puffs into the lungs every 6 (six) hours as needed for wheezing or shortness of breath.   ARTIFICIAL TEARS OP Place 1-2 drops into both eyes daily.   aspirin EC 81 MG tablet Take 81 mg by mouth daily.   budesonide-formoterol 160-4.5 MCG/ACT inhaler  Commonly known as: SYMBICORT Inhale 2 puffs into the lungs 2 (two) times daily.   CoQ10 100 MG Caps Take 100 mg by mouth daily.   finasteride 5 MG tablet Commonly known as: PROSCAR Take 1 tablet (5 mg total) by mouth daily.   gabapentin 100 MG capsule Commonly known as: NEURONTIN Take 100 mg by mouth 2 (two) times daily.   ipratropium 0.03 % nasal spray Commonly known as: ATROVENT Place 1 spray into both nostrils 2 (two) times daily.   levothyroxine 75 MCG tablet Commonly known as: SYNTHROID Take 75 mcg by mouth daily before breakfast.   metoprolol tartrate 25 MG tablet Commonly known as: LOPRESSOR Take 12.5 mg by mouth 2 (two) times daily.   montelukast 10 MG tablet Commonly known as: SINGULAIR Take 10 mg by mouth at bedtime.   multivitamin tablet Take 1 tablet by mouth daily.   polyethylene glycol 17 g packet Commonly known as: MIRALAX / GLYCOLAX Take 17 g by mouth daily as needed for moderate constipation.   pravastatin 40 MG tablet Commonly known as: PRAVACHOL Take 40 mg by mouth every evening.   PRESERVISION AREDS 2 PO Take 1 capsule by mouth daily.   PROBIOTIC PO Take 1 tablet by mouth daily.   tamsulosin 0.4 MG Caps capsule Commonly known as: FLOMAX Take 1 capsule (0.4 mg total) by mouth daily.   triamcinolone cream 0.1 % Commonly known as: KENALOG  Apply topically.       Allergies: No Known Allergies  Family History: Family History  Problem Relation Age of Onset  . Coronary artery disease Mother   . Alzheimer's disease Father   . Coronary artery disease Sister   . Kidney disease Neg Hx   . Prostate cancer Neg Hx     Social History:  reports that he has never smoked. He has never used smokeless tobacco. He reports current alcohol use. He reports that he does not use drugs.  ROS: UROLOGY Frequent Urination?: Yes Hard to postpone urination?: No Burning/pain with urination?: No Get up at night to urinate?: Yes Leakage of urine?: No  Urine stream starts and stops?: Yes Trouble starting stream?: Yes Do you have to strain to urinate?: No Blood in urine?: No Urinary tract infection?: No Sexually transmitted disease?: No Injury to kidneys or bladder?: No Painful intercourse?: No Weak stream?: Yes Erection problems?: No Penile pain?: No  Gastrointestinal Nausea?: No Vomiting?: No Indigestion/heartburn?: No Diarrhea?: No Constipation?: Yes  Constitutional Fever: No Night sweats?: No Weight loss?: No Fatigue?: No  Skin Skin rash/lesions?: No Itching?: No  Eyes Blurred vision?: No Double vision?: No  Ears/Nose/Throat Sore throat?: No Sinus problems?: No  Hematologic/Lymphatic Swollen glands?: No Easy bruising?: Yes  Cardiovascular Leg swelling?: No Chest pain?: No  Respiratory Cough?: No Shortness of breath?: No  Endocrine Excessive thirst?: No  Musculoskeletal Back pain?: Yes Joint pain?: No  Neurological Headaches?: No Dizziness?: Yes  Psychologic Depression?: No Anxiety?: No  Physical Exam: BP (!) 145/73 (BP Location: Left Arm, Patient Position: Sitting, Cuff Size: Normal)   Pulse 91   Ht 5\' 9"  (1.753 m)   Wt 187 lb (84.8 kg)   BMI 27.62 kg/m   Constitutional:  Alert and oriented, No acute distress. HEENT: [place] AT, moist mucus membranes.  Trachea midline, no masses. Cardiovascular: No clubbing, cyanosis, or edema. Respiratory: Normal respiratory effort, no increased work of breathing.   Assessment & Plan:    There are no diagnoses linked to this encounter.  - BPH with lower urinary tract symptoms Urodynamic study is consistent with outlet obstruction.  He is on tamsulosin and finasteride.  We discussed outlet procedures including TURP, TUVP and PVP.  He is not sure his symptoms are bothersome enough that he desires an additional procedure and would like to think this over and discuss with his family.  He will call back if he desires to schedule.  Greater than 50% of  this 15-minute visit was spent counseling the patient.   Abbie Sons, [place] [address], [place], [place] [address] [phone]

## 2019-10-04 ENCOUNTER — Encounter: Payer: Self-pay | Admitting: Urology

## 2019-10-27 ENCOUNTER — Ambulatory Visit: Payer: Medicare Other | Admitting: Urology

## 2019-11-18 ENCOUNTER — Other Ambulatory Visit: Payer: Self-pay | Admitting: Urology

## 2019-11-18 DIAGNOSIS — N138 Other obstructive and reflux uropathy: Secondary | ICD-10-CM

## 2019-12-01 ENCOUNTER — Ambulatory Visit: Payer: Medicare Other | Admitting: Urology

## 2019-12-02 ENCOUNTER — Ambulatory Visit: Payer: Medicare Other | Admitting: Urology

## 2020-02-02 ENCOUNTER — Other Ambulatory Visit: Payer: Self-pay | Admitting: Internal Medicine

## 2020-02-02 DIAGNOSIS — M48062 Spinal stenosis, lumbar region with neurogenic claudication: Secondary | ICD-10-CM

## 2020-02-12 ENCOUNTER — Ambulatory Visit
Admission: RE | Admit: 2020-02-12 | Discharge: 2020-02-12 | Disposition: A | Discharge status: Home / Self Care | Payer: Medicare Other | Source: Ambulatory Visit | Attending: Internal Medicine | Admitting: Internal Medicine

## 2020-02-12 ENCOUNTER — Other Ambulatory Visit: Payer: Self-pay

## 2020-02-12 DIAGNOSIS — M48062 Spinal stenosis, lumbar region with neurogenic claudication: Secondary | ICD-10-CM | POA: Diagnosis not present

## 2020-02-12 IMAGING — MR MR LUMBAR SPINE W/O CM
5 series · 31 of 48 positions shown · non-contrast
Comparison: None.

CLINICAL DATA: Tingling and numbness in both legs

EXAM:
MRI LUMBAR SPINE WITHOUT CONTRAST
TECHNIQUE: Multiplanar, multisequence MR imaging of the lumbar spine was
performed. No intravenous contrast was administered.

[Series 5: T2 · sagittal · 4.0mm · 0.81mm/px · 6 of 18 slices shown (1 of 2)]
[im 1/18]
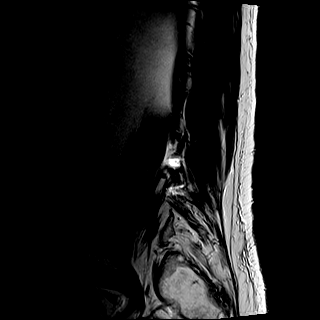
[im 4/18]
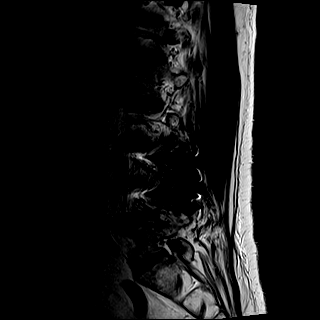
[im 7/18]
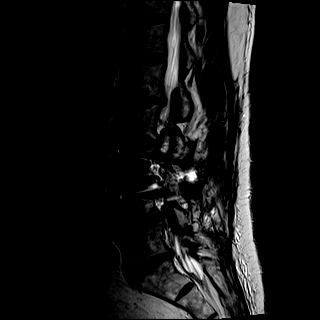
[im 11/18]
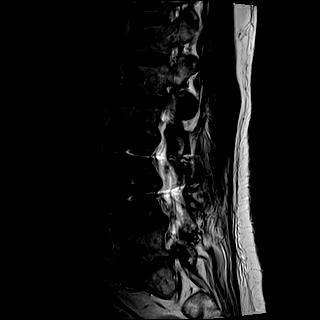
[im 14/18]
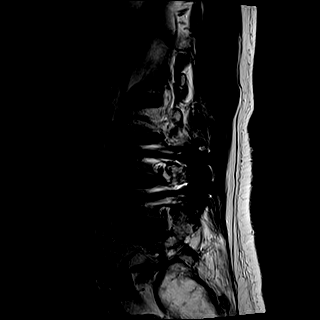
[im 18/18]
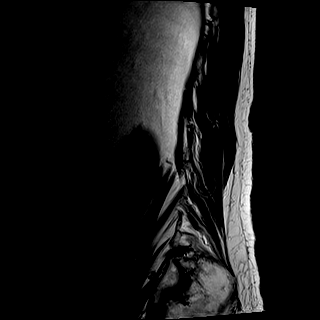

[Series 6: T1 · sagittal · 4.0mm · 0.81mm/px · 7 of 18 slices shown (1 of 2)]
[im 1/18]
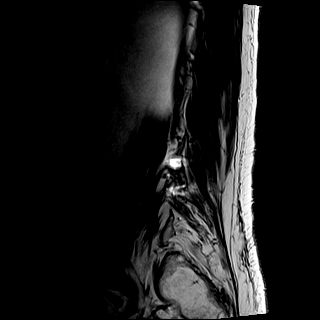
[im 3/18]
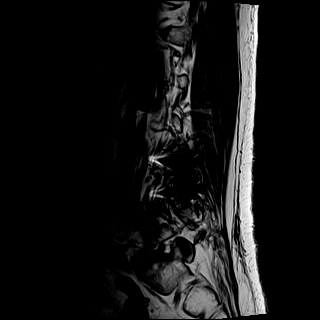
[im 6/18]
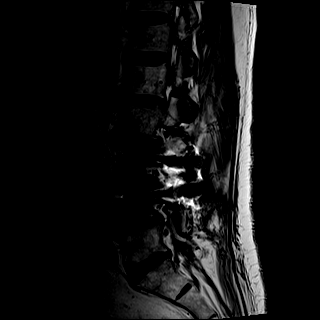
[im 9/18]
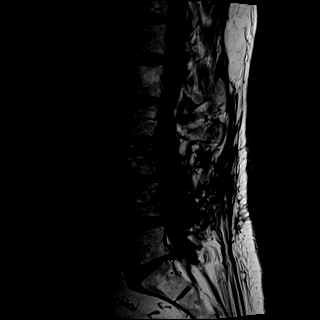
[im 12/18]
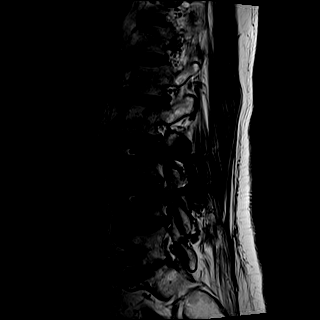
[im 15/18]
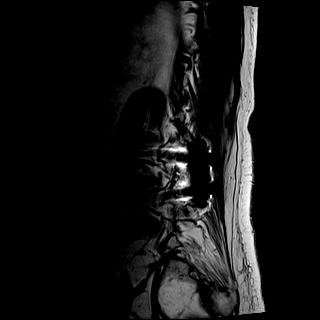
[im 18/18]
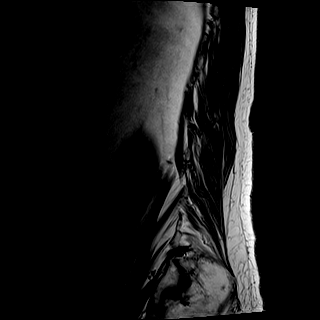

[Series 7: STIR · sagittal · 4.0mm · 0.41mm/px · 2 of 18 slices shown]
[im 1/18]
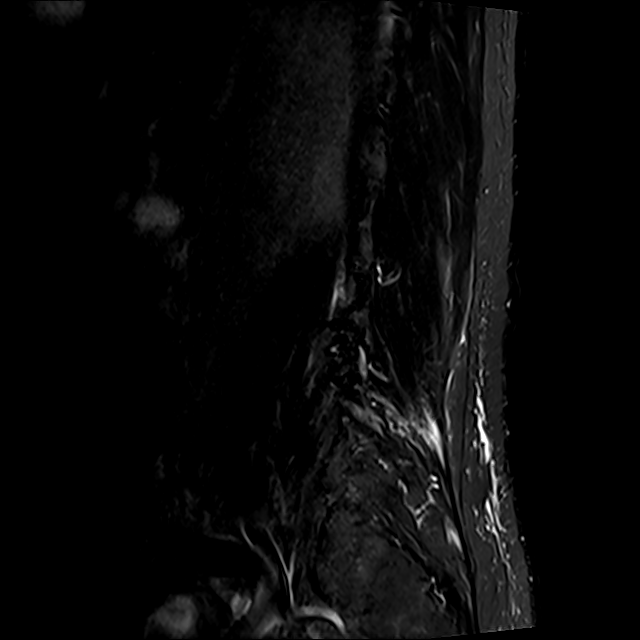
[im 3/18]
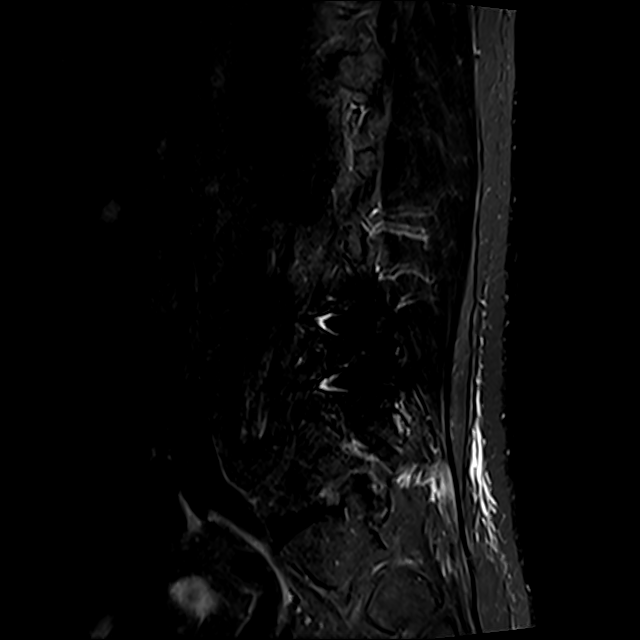

[Series 8: T2 · axial · 4.0mm · 0.78mm/px · z∈[-102,+102]mm · 8 of 36 slices shown (2 of 2)]
[im 1/36]
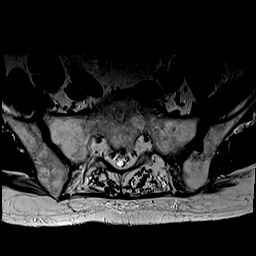
[im 6/36]
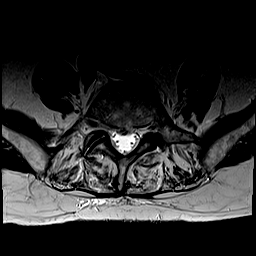
[im 11/36]
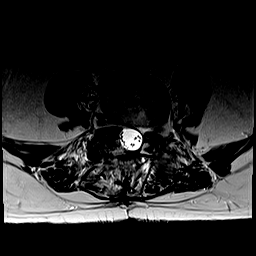
[im 17/36]
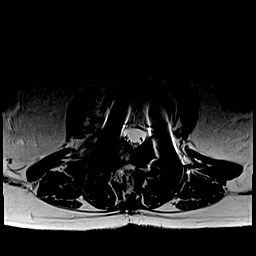
[im 19/36]
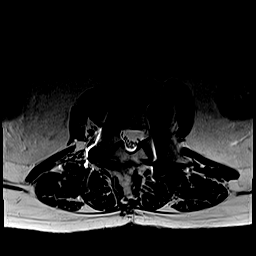
[im 25/36]
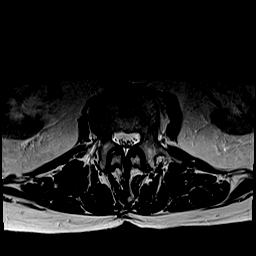
[im 30/36]
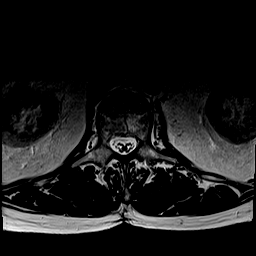
[im 36/36]
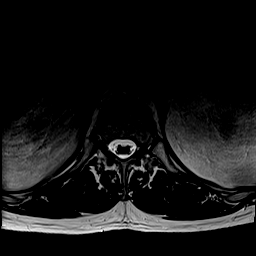

[Series 9: T1 · axial · 4.0mm · 0.39mm/px · z∈[-102,+102]mm · 8 of 36 slices shown (2 of 2)]
[im 1/36]
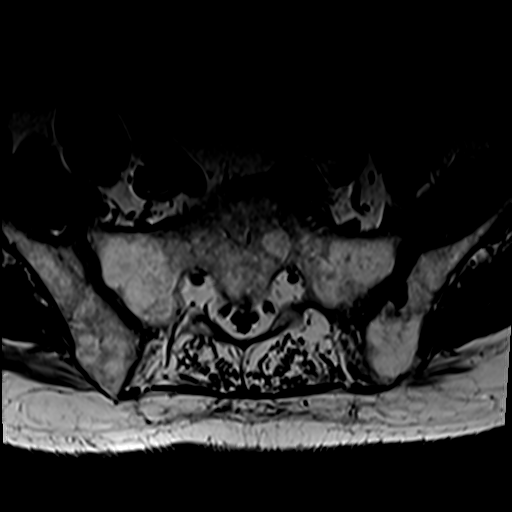
[im 6/36]
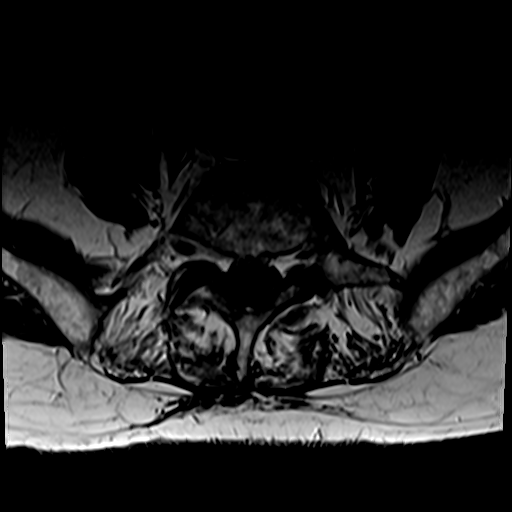
[im 11/36]
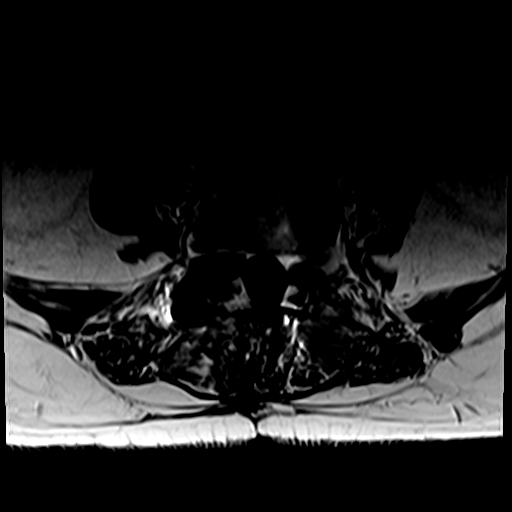
[im 17/36]
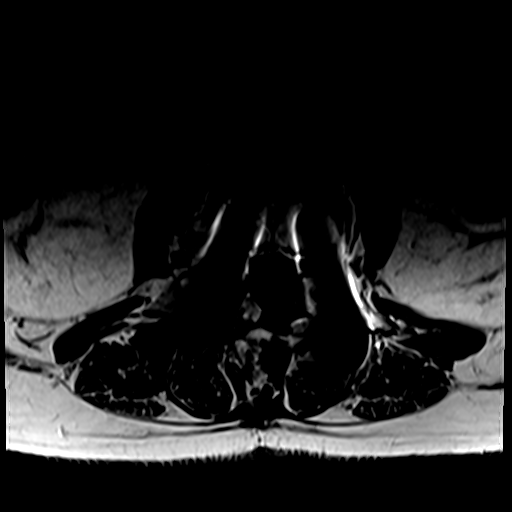
[im 19/36]
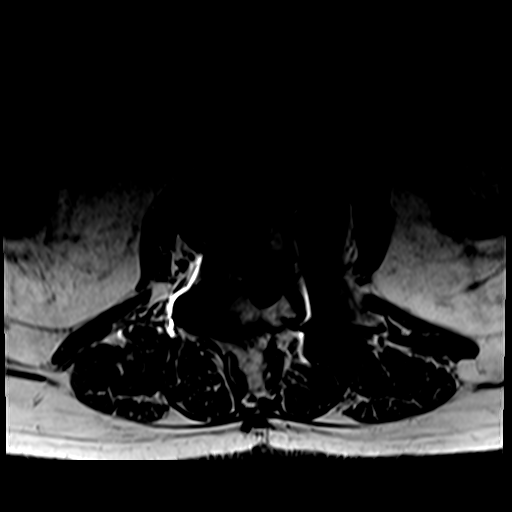
[im 25/36]
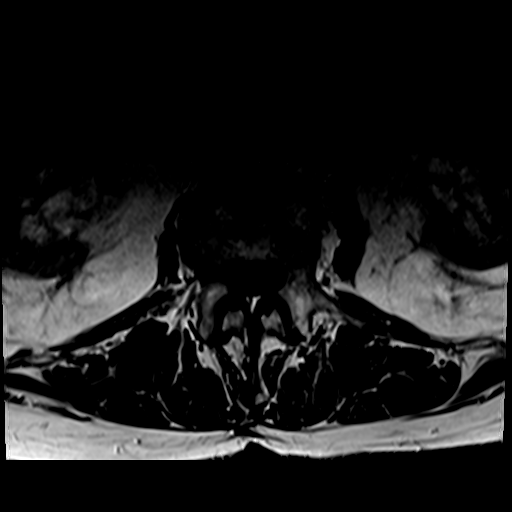
[im 30/36]
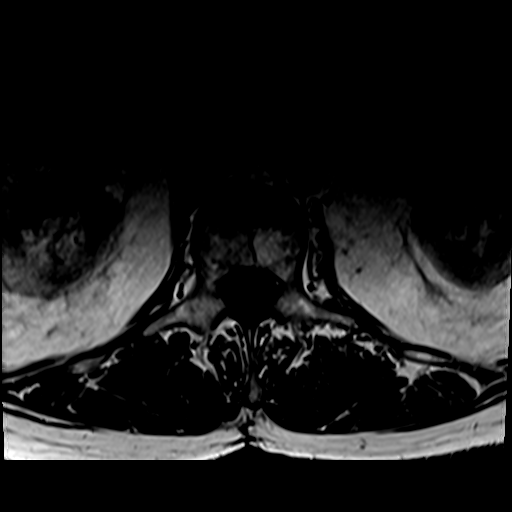
[im 36/36]
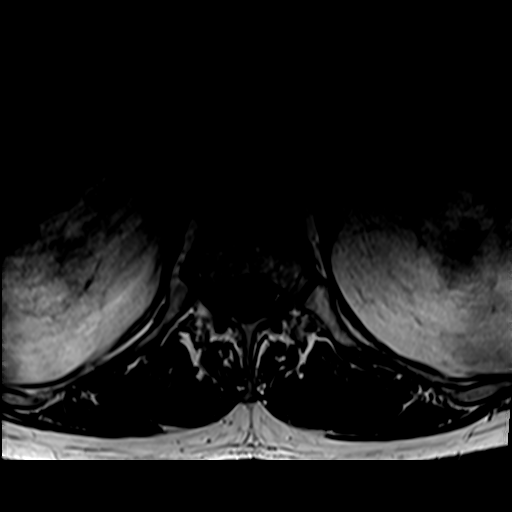

[31 of 48 positions shown; findings below may reference images not displayed]

FINDINGS: Segmentation:  Standard.

Alignment:  Minimal grade 1 anterolisthesis of L3 on L4.

Vertebrae:  No fracture, evidence of discitis, or bone lesion.

Conus medullaris and cauda equina: Conus extends to the T12 level.
Conus and cauda equina appear normal.

Paraspinal and other soft tissues: No acute paraspinal abnormality.

Disc levels:

Disc spaces: Posterior lumbar fusion at L3-4. Degenerative disease
with disc height loss at L1-2, L2-3, L4-5 and L5-S1.

T12-L1: No significant disc bulge. No evidence of neural foraminal
stenosis. No central canal stenosis.

L1-L2: Mild broad-based disc bulge. Mild bilateral facet
arthropathy. Mild left foraminal stenosis. No right foraminal
stenosis. No central canal stenosis.

L2-L3: Broad-based disc bulge. Mild bilateral facet arthropathy. No
evidence of neural foraminal stenosis. No central canal stenosis.

L3-L4: Interbody fusion. Mild bilateral foraminal narrowing. No
central canal stenosis.

L4-L5: Broad-based disc bulge. Prior laminectomy. Moderate bilateral
facet arthropathy. Mild right foraminal narrowing. No left foraminal
narrowing. No central canal stenosis.

L5-S1: Mild broad-based disc bulge with a small central disc
protrusion. Mild bilateral facet arthropathy. No evidence of neural
foraminal stenosis. No central canal stenosis.
IMPRESSION: 1. Lumbar spine spondylosis as described above.
2. At L4-5 there is a broad-based disc bulge. Prior laminectomy.
Moderate bilateral facet arthropathy. Mild right foraminal
narrowing. No left foraminal narrowing.
3. Posterior lumbar interbody fusion at L3-4. Mild bilateral
foraminal narrowing.
4. At L1-2 there is a mild broad-based disc bulge. Mild bilateral
facet arthropathy. Mild left foraminal stenosis.

## 2020-03-16 ENCOUNTER — Ambulatory Visit: Payer: Medicare Other | Admitting: Urology

## 2020-03-16 ENCOUNTER — Other Ambulatory Visit: Payer: Self-pay

## 2020-03-16 ENCOUNTER — Encounter: Payer: Self-pay | Admitting: Urology

## 2020-03-16 VITALS — BP 136/84 | HR 89 | Ht 69.0 in | Wt 190.0 lb

## 2020-03-16 DIAGNOSIS — N138 Other obstructive and reflux uropathy: Secondary | ICD-10-CM | POA: Diagnosis not present

## 2020-03-16 DIAGNOSIS — N401 Enlarged prostate with lower urinary tract symptoms: Secondary | ICD-10-CM | POA: Diagnosis not present

## 2020-03-16 LAB — BLADDER SCAN AMB NON-IMAGING: Scan Result: 20

## 2020-03-16 NOTE — Progress Notes (Signed)
03/16/2020 1:51 PM   Dakota Garza 12-07-1923 [id number]  Referring provider: Kirk Ruths, MD [place] [facility] [place],  Dakota Garza [address]  Chief Complaint  Patient presents with  . Benign Prostatic Hypertrophy    HPI: 86 y.o. male presents for follow-up.  -UroLift February 2020 -Persistent symptoms postop including frequency, intermittent urinary stream, hesitancy, nocturia x2 -UD study 09/2019 consistent with outlet obstruction -Declined consideration of additional implants -Was considering TURP however decided to hold off -Stable voiding symptoms since visit 10/2019 -Also questioning if he had a left inguinal hernia -No dysuria, gross hematuria -No flank or abdominal pain.  Occasional suprapubic pressure   PMH: Past Medical History:  Diagnosis Date  . Asthma in adult without complication   . BPH (benign prostatic hyperplasia)   . Glaucoma   . Hypothyroidism   . Oral cancer [facility])     Surgical History: Past Surgical History:  Procedure Laterality Date  . CYSTOSCOPY WITH INSERTION OF UROLIFT N/A 02/24/2019   Procedure: CYSTOSCOPY WITH INSERTION OF UROLIFT;  Surgeon: Abbie Sons, MD;  Location: [facility] ORS;  Service: Urology;  Laterality: N/A;  . Oral cancer Surgery  2012  . POSTERIOR FUSION SPINAL DEFORMITY  2015  . prostate vaporization  2012    Home Medications:  Allergies as of 03/16/2020   No Known Allergies     Medication List       Accurate as of March 16, 2020  1:51 PM. If you have any questions, ask your nurse or doctor.        albuterol 108 (90 Base) MCG/ACT inhaler Commonly known as: VENTOLIN HFA Inhale 2 puffs into the lungs every 6 (six) hours as needed for wheezing or shortness of breath.   ARTIFICIAL TEARS OP Place 1-2 drops into both eyes daily.   aspirin EC 81 MG tablet Take 81 mg by mouth daily.   budesonide-formoterol 160-4.5 MCG/ACT inhaler Commonly known as: SYMBICORT Inhale 2 puffs into the  lungs 2 (two) times daily.   CoQ10 100 MG Caps Take 100 mg by mouth daily.   finasteride 5 MG tablet Commonly known as: PROSCAR Take 1 tablet (5 mg total) by mouth daily.   gabapentin 100 MG capsule Commonly known as: NEURONTIN Take 100 mg by mouth 2 (two) times daily.   ipratropium 0.03 % nasal spray Commonly known as: ATROVENT Place 1 spray into both nostrils 2 (two) times daily.   levothyroxine 75 MCG tablet Commonly known as: SYNTHROID Take 75 mcg by mouth daily before breakfast.   metoprolol tartrate 25 MG tablet Commonly known as: LOPRESSOR Take 12.5 mg by mouth 2 (two) times daily.   montelukast 10 MG tablet Commonly known as: SINGULAIR Take 10 mg by mouth at bedtime.   multivitamin tablet Take 1 tablet by mouth daily.   polyethylene glycol 17 g packet Commonly known as: MIRALAX / GLYCOLAX Take 17 g by mouth daily as needed for moderate constipation.   pravastatin 40 MG tablet Commonly known as: PRAVACHOL Take 40 mg by mouth every evening.   PRESERVISION AREDS 2 PO Take 1 capsule by mouth daily.   PROBIOTIC PO Take 1 tablet by mouth daily.   tamsulosin 0.4 MG Caps capsule Commonly known as: FLOMAX TAKE 1 CAPSULE BY MOUTH  DAILY   triamcinolone cream 0.1 % Commonly known as: KENALOG Apply topically.       Allergies: No Known Allergies  Family History: Family History  Problem Relation Age of Onset  . Coronary  artery disease Mother   . Alzheimer's disease Father   . Coronary artery disease Sister   . Kidney disease Neg Hx   . Prostate cancer Neg Hx     Social History:  reports that he has never smoked. He has never used smokeless tobacco. He reports current alcohol use. He reports that he does not use drugs.   Physical Exam: BP 136/84   Pulse 89   Ht 5\' 9"  (1.753 m)   Wt 190 lb (86.2 kg)   BMI 28.06 kg/m   Constitutional:  Alert and oriented, No acute distress. HEENT: [place] AT, moist mucus membranes.  Trachea midline, no  masses. Cardiovascular: No clubbing, cyanosis, or edema. Respiratory: Normal respiratory effort, no increased work of breathing. GI: Abdomen is soft, nontender, nondistended, no abdominal masses GU: No inguinal hernia appreciated Skin: No rashes, bruises or suspicious lesions. Neurologic: Grossly intact, no focal deficits, moving all 4 extremities. Psychiatric: Normal mood and affect.   Assessment & Plan:    - Benign prostatic hyperplasia with urinary obstruction PVR by bladder scan was 20 mL.  He is uncertain if he desires to proceed another outlet procedure and would like to hold off for now.  I did recommend office cystoscopy if he was interested in pursuing surgery.  Will schedule a tentative 18-month follow-up however he was instructed to call earlier if he desire to proceed with cystoscopy.   Abbie Sons, [place] [address] [place],  [address] [phone]

## 2020-03-18 ENCOUNTER — Encounter: Payer: Self-pay | Admitting: Urology

## 2020-07-28 ENCOUNTER — Encounter: Payer: Self-pay | Admitting: Urology

## 2020-07-28 ENCOUNTER — Other Ambulatory Visit: Payer: Self-pay

## 2020-07-28 ENCOUNTER — Ambulatory Visit (INDEPENDENT_AMBULATORY_CARE_PROVIDER_SITE_OTHER): Payer: Medicare Other | Admitting: Urology

## 2020-07-28 VITALS — BP 158/62 | HR 87 | Ht 69.0 in | Wt 190.0 lb

## 2020-07-28 DIAGNOSIS — N5082 Scrotal pain: Secondary | ICD-10-CM

## 2020-07-29 ENCOUNTER — Ambulatory Visit: Payer: Self-pay | Admitting: Urology

## 2020-07-31 ENCOUNTER — Encounter: Payer: Self-pay | Admitting: Urology

## 2020-07-31 MED ORDER — GABAPENTIN 100 MG PO CAPS: ORAL_CAPSULE | ORAL | 0 refills | Status: DC

## 2020-07-31 NOTE — Progress Notes (Signed)
07/28/2020 11:21 AM   Dakota Garza 01/25/1923 [id number]  Referring provider: Kirk Ruths, MD [facility] [place],  [place] [address]  Chief Complaint  Patient presents with   Other    groin pain     HPI: 86 y.o. male followed for BPH who underwent UroLift February 2020.  He has chronic left scrotal pain and requested an appointment to have this checked.   Pain occurs intermittently  Described as sharp and usually last from a few seconds-5 minutes  No bothersome LUTS  Denies dysuria, gross hematuria  Pain nonradiating  No identifiable precipitating, aggravating or alleviating factors  Severity rated mild   PMH: Past Medical History:  Diagnosis Date   Asthma in adult without complication    BPH (benign prostatic hyperplasia)    Glaucoma    Hypothyroidism    Oral cancer ([place])     Surgical History: Past Surgical History:  Procedure Laterality Date   CYSTOSCOPY WITH INSERTION OF UROLIFT N/A 02/24/2019   Procedure: CYSTOSCOPY WITH INSERTION OF UROLIFT;  Surgeon: Abbie Sons, MD;  Location: [facility] ORS;  Service: Urology;  Laterality: N/A;   Oral cancer Surgery  2012   POSTERIOR FUSION SPINAL DEFORMITY  2015   prostate vaporization  2012    Home Medications:  Allergies as of 07/28/2020   No Known Allergies     Medication List       Accurate as of July 28, 2020 11:59 PM. If you have any questions, ask your nurse or doctor.        albuterol 108 (90 Base) MCG/ACT inhaler Commonly known as: VENTOLIN HFA Inhale 2 puffs into the lungs every 6 (six) hours as needed for wheezing or shortness of breath.   ARTIFICIAL TEARS OP Place 1-2 drops into both eyes daily.   aspirin EC 81 MG tablet Take 81 mg by mouth daily.   budesonide-formoterol 160-4.5 MCG/ACT inhaler Commonly known as: SYMBICORT Inhale 2 puffs into the lungs 2 (two) times daily.   CoQ10 100 MG Caps Take 100 mg by mouth daily.     finasteride 5 MG tablet Commonly known as: PROSCAR Take 1 tablet (5 mg total) by mouth daily.   gabapentin 100 MG capsule Commonly known as: NEURONTIN Take 100 mg by mouth 2 (two) times daily.   ipratropium 0.03 % nasal spray Commonly known as: ATROVENT Place 1 spray into both nostrils 2 (two) times daily.   levothyroxine 75 MCG tablet Commonly known as: SYNTHROID Take 75 mcg by mouth daily before breakfast.   metoprolol tartrate 25 MG tablet Commonly known as: LOPRESSOR Take 12.5 mg by mouth 2 (two) times daily.   montelukast 10 MG tablet Commonly known as: SINGULAIR Take 10 mg by mouth at bedtime.   multivitamin tablet Take 1 tablet by mouth daily.   polyethylene glycol 17 g packet Commonly known as: MIRALAX / GLYCOLAX Take 17 g by mouth daily as needed for moderate constipation.   pravastatin 40 MG tablet Commonly known as: PRAVACHOL Take 40 mg by mouth every evening.   PRESERVISION AREDS 2 PO Take 1 capsule by mouth daily.   PROBIOTIC PO Take 1 tablet by mouth daily.   tamsulosin 0.4 MG Caps capsule Commonly known as: FLOMAX TAKE 1 CAPSULE BY MOUTH  DAILY   triamcinolone cream 0.1 % Commonly known as: KENALOG Apply topically.       Allergies: No Known Allergies  Family History: Family History  Problem Relation Age of Onset  Coronary artery disease Mother    Alzheimer's disease Father    Coronary artery disease Sister    Kidney disease Neg Hx    Prostate cancer Neg Hx     Social History:  reports that he has never smoked. He has never used smokeless tobacco. He reports current alcohol use. He reports that he does not use drugs.   Physical Exam: BP (!) 158/62    Pulse 87    Ht 5\' 9"  (1.753 m)    Wt 190 lb (86.2 kg)    BMI 28.06 kg/m   Constitutional:  Alert and oriented, No acute distress. HEENT: [place] AT, moist mucus membranes.  Trachea midline, no masses. Cardiovascular: No clubbing, cyanosis, or edema. Respiratory: Normal respiratory  effort, no increased work of breathing. GI: Abdomen is soft, nontender, nondistended, no abdominal masses GU: Phallus without lesions, testes descended bilaterally without masses or tenderness.  No palpable hernia or spermatic cord abnormalities Skin: No rashes, bruises or suspicious lesions. Neurologic: Grossly intact, no focal deficits, moving all 4 extremities. Psychiatric: Normal mood and affect.   Assessment & Plan:    1.  Chronic scrotal pain  History of spinal stenosis with neurogenic claudication and scrotal pain may be neuropathic  On a low-dose of gabapentin and will increase   Abbie Sons, MD  [place] [address] [place], [place] [address] [phone]

## 2020-09-15 ENCOUNTER — Other Ambulatory Visit: Payer: Self-pay | Admitting: Urology

## 2020-09-21 ENCOUNTER — Ambulatory Visit: Payer: Self-pay | Admitting: Urology

## 2020-12-13 ENCOUNTER — Other Ambulatory Visit: Payer: Self-pay | Admitting: Family Medicine

## 2020-12-13 DIAGNOSIS — Z0000 Encounter for general adult medical examination without abnormal findings: Secondary | ICD-10-CM

## 2020-12-13 DIAGNOSIS — R1031 Right lower quadrant pain: Secondary | ICD-10-CM

## 2020-12-28 ENCOUNTER — Ambulatory Visit
Admission: RE | Admit: 2020-12-28 | Discharge: 2020-12-28 | Disposition: A | Discharge status: Home / Self Care | Payer: Medicare Other | Source: Ambulatory Visit | Attending: Family Medicine | Admitting: Family Medicine

## 2020-12-28 ENCOUNTER — Other Ambulatory Visit: Payer: Self-pay

## 2020-12-28 DIAGNOSIS — Z0000 Encounter for general adult medical examination without abnormal findings: Secondary | ICD-10-CM

## 2020-12-28 DIAGNOSIS — R1031 Right lower quadrant pain: Secondary | ICD-10-CM | POA: Diagnosis not present

## 2020-12-28 LAB — POCT I-STAT CREATININE: Creatinine, Ser: 1.2 mg/dL (ref 0.61–1.24)

## 2020-12-28 IMAGING — CT CT ABD-PELV W/ CM
2 of 5 series · 15 of 46 positions shown, 17 images · IV contrast (omnipaque)
Comparison: None.

CLINICAL DATA: Bilateral groin pain

EXAM:
CT ABDOMEN AND PELVIS WITH CONTRAST
TECHNIQUE: Multidetector CT imaging of the abdomen and pelvis was performed
using the standard protocol following bolus administration of
intravenous contrast.
CONTRAST:  100mL OMNIPAQUE IOHEXOL 300 MG/ML  SOLN

[Series 2: abd pelvis 5.00 · axial · 0.97mm/px · z∈[-1548,-1123]mm · 12 of 97 slices shown, 14 images]
[im 6/97  soft-tissue]
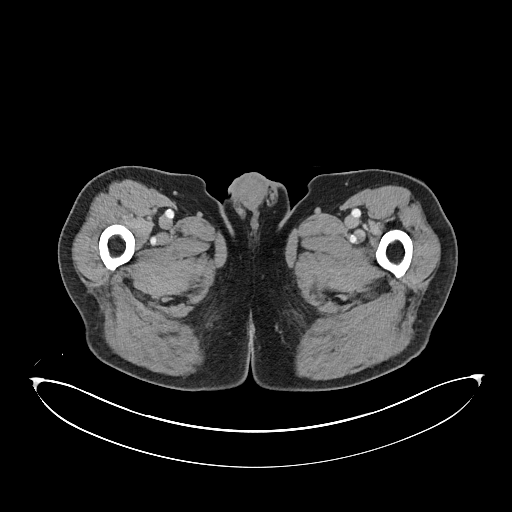
[im 6/97  bone]
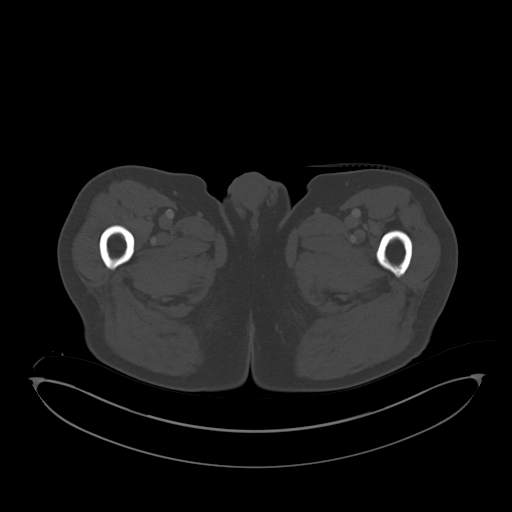
[im 17/97  soft-tissue]
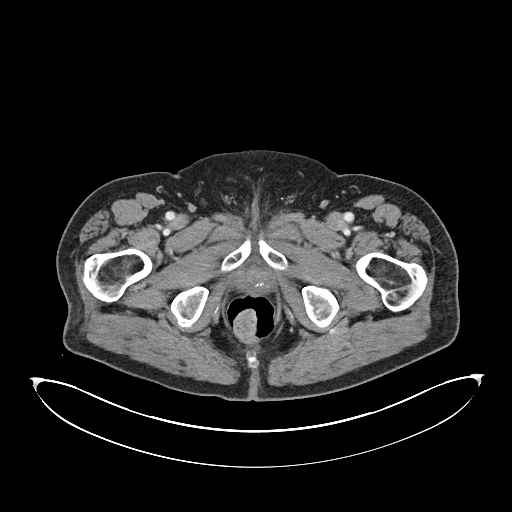
[im 22/97  soft-tissue]
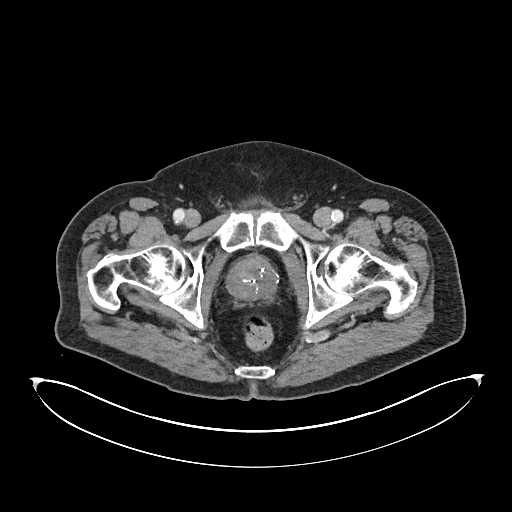
[im 27/97  soft-tissue]
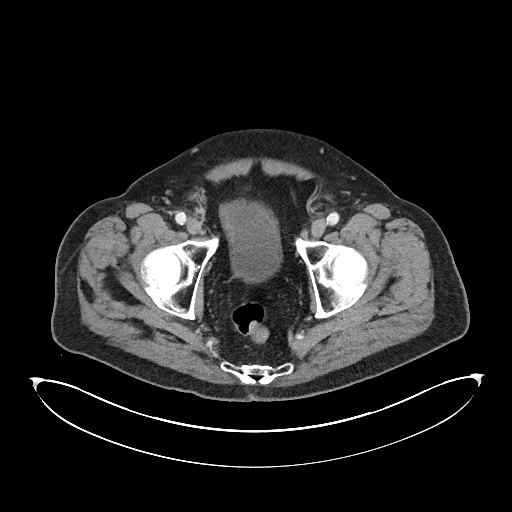
[im 38/97  soft-tissue]
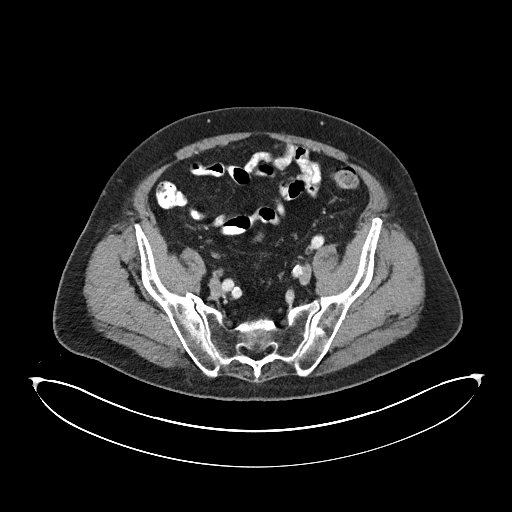
[im 43/97  soft-tissue]
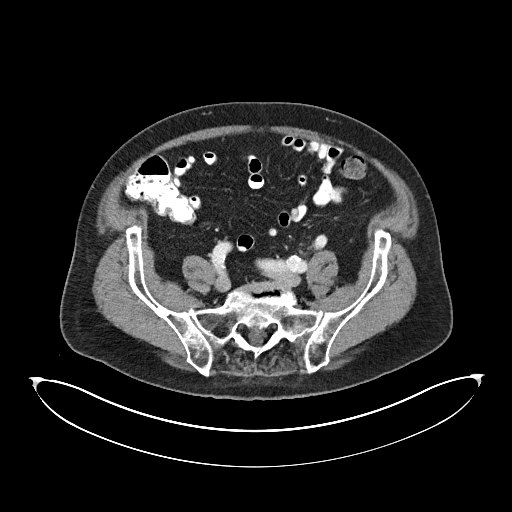
[im 54/97  soft-tissue]
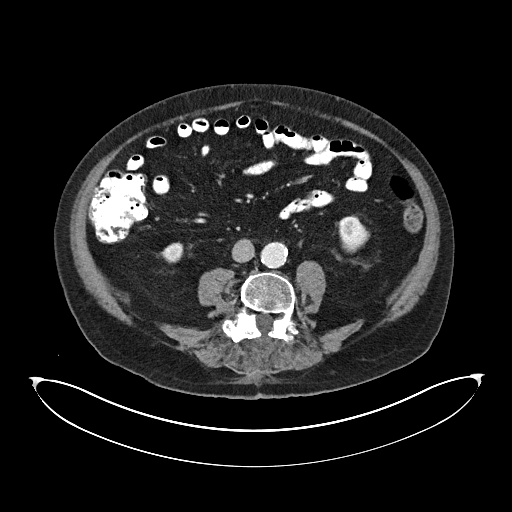
[im 59/97  soft-tissue]
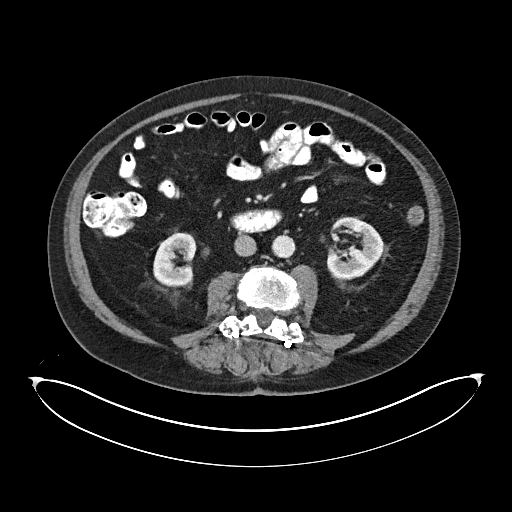
[im 70/97  soft-tissue]
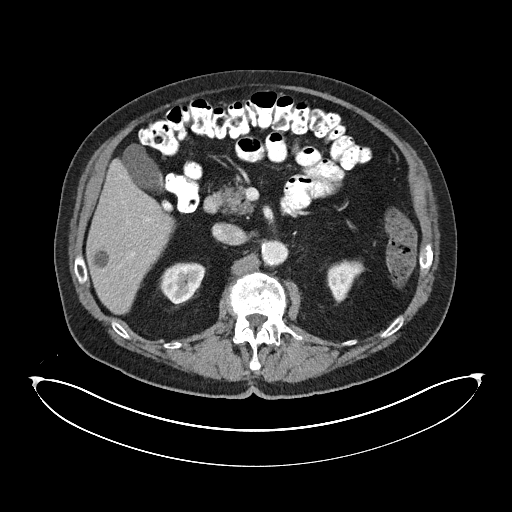
[im 70/97  bone]
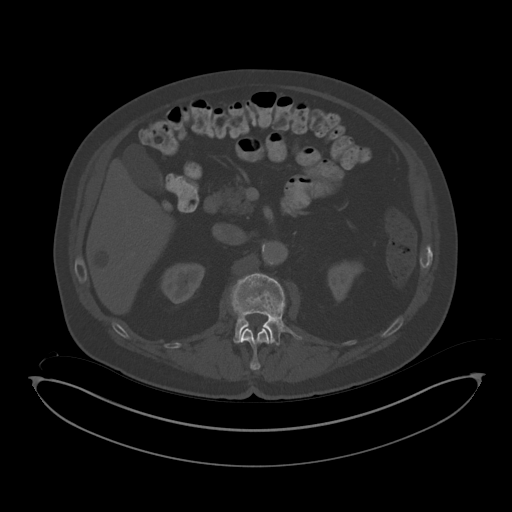
[im 75/97  soft-tissue]
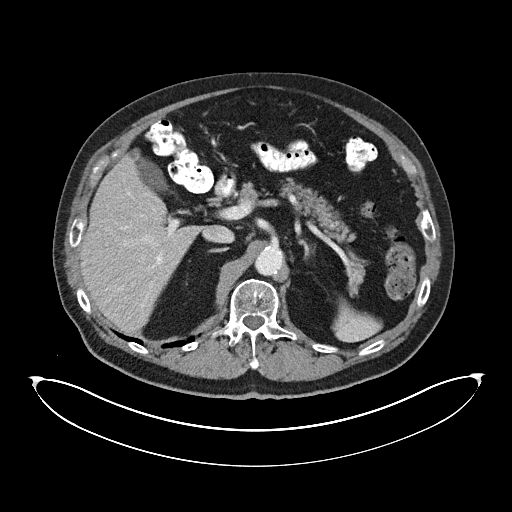
[im 81/97  soft-tissue]
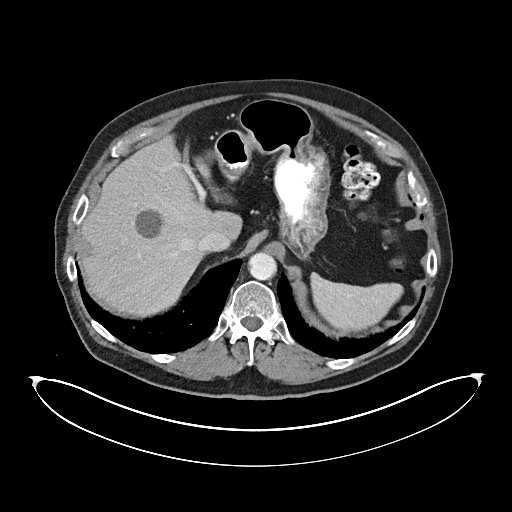
[im 91/97  soft-tissue]
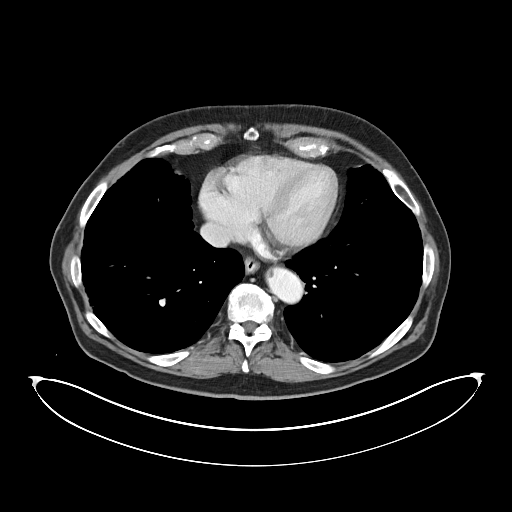

[Series 4: coronals abd pelvis 2.00 cor · coronal · 0.82mm/px · 3 of 164 slices shown]
[im 55/164  soft-tissue]
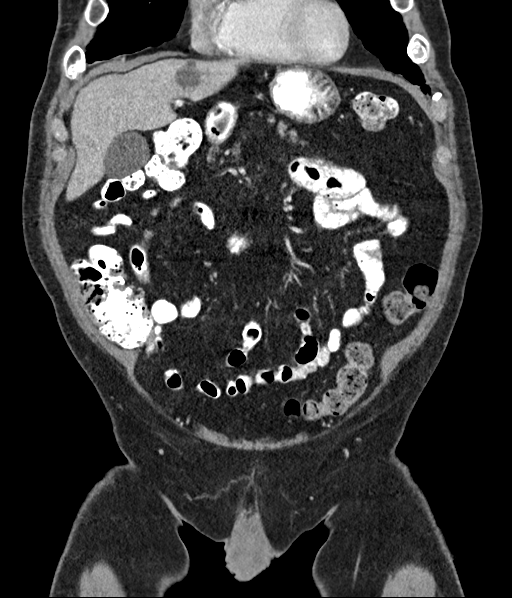
[im 73/164  soft-tissue]
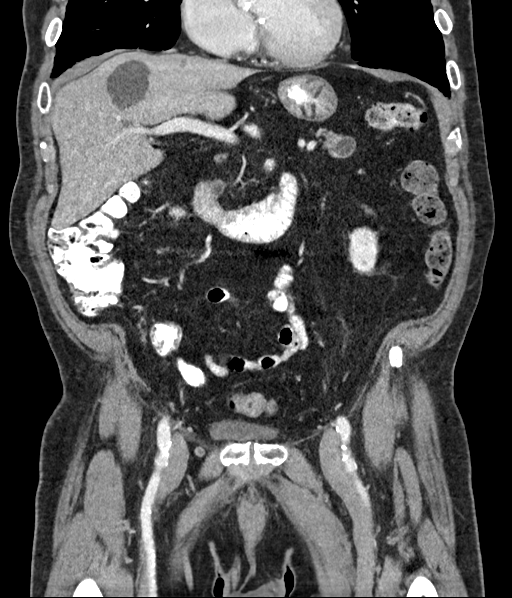
[im 91/164  soft-tissue]
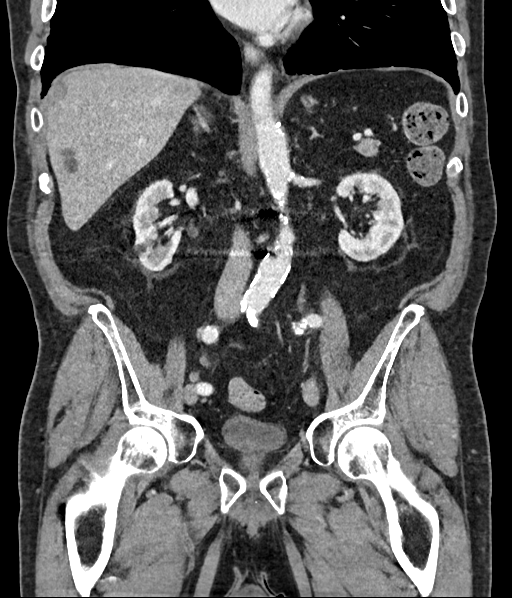

[15 of 46 positions shown; findings below may reference images not displayed]

FINDINGS: Lower chest: 8 mm pulmonary nodule at the visualized right lung base
demonstrates macroscopic fat is most compatible with a benign
pulmonary hamartoma. Mild right basilar scarring. Moderate right
coronary artery calcification. Global cardiac size within normal
limits. Small hiatal hernia.

Hepatobiliary: Multiple simple cysts scattered throughout the liver.
Liver otherwise unremarkable. Gallbladder unremarkable. No intra or
extrahepatic biliary ductal dilation.

Pancreas: 16 mm cystic lesion noted within the a distal body of the
pancreas without associated calcification this is nonspecific and
may represent a simple pancreatic cyst, and intrapancreatic
pseudocyst, or cystic neoplasm. The pancreas is otherwise
unremarkable.

Spleen: Unremarkable

Adrenals/Urinary Tract: The adrenal glands are normal in size. A
heterogeneously, avidly enhancing mass is seen anteriorly within the
interpolar region of the right kidney measuring 2.2 x 2.7 x 2.5 cm
in greatest dimension most in keeping with a primary renal neoplasm
tiny simple cortical cyst noted within the lower pole of the right
kidney. No other enhancing intrarenal masses. No hydronephrosis. No
intrarenal or ureteral calculi. The bladder is unremarkable.

Stomach/Bowel: Stomach is within normal limits. Appendix appears
normal. No evidence of bowel wall thickening, distention, or
inflammatory changes. No free intraperitoneal gas or fluid.

Vascular/Lymphatic: Moderate aortoiliac atherosclerotic
calcification. No aortic aneurysm. No pathologic adenopathy within
the abdomen and pelvis.

Reproductive: Brachytherapy seeds are seen within the prostate
gland. There is mild prostatic enlargement. Enlarged central lobe
indents the base of the bladder.

Other: Rectum unremarkable.  No abdominal wall hernia.

Musculoskeletal: L3-4 posterior spinal fusion with instrumentation
and decompression has been performed. Superimposed degenerative
changes are seen throughout the lumbar spine. No lytic or blastic
bone lesions are identified.
IMPRESSION: 2.7 cm mass within the right kidney most in keeping with a primary
renal neoplasm in a patient of this age.

16 mm cystic lesion within the a tail the pancreas. Differential
considerations as listed above. This could be further assessed with
dedicated MRI examination, if indicated.

No definite radiographic explanation for the patient's reported
symptoms. No acute intra-abdominal pathology.

Aortic Atherosclerosis ([1X]-[1X]).

## 2020-12-28 MED ORDER — IOHEXOL 300 MG/ML  SOLN
100.0000 mL | Freq: Once | INTRAMUSCULAR | Status: AC | PRN
  Administered 2020-12-28: 100 mL via INTRAVENOUS

## 2021-01-05 ENCOUNTER — Other Ambulatory Visit: Payer: Self-pay | Admitting: Family Medicine

## 2021-01-05 DIAGNOSIS — K869 Disease of pancreas, unspecified: Secondary | ICD-10-CM

## 2021-01-06 ENCOUNTER — Ambulatory Visit: Payer: Medicare Other | Admitting: Urology

## 2021-01-16 ENCOUNTER — Other Ambulatory Visit: Payer: Self-pay | Admitting: Urology

## 2021-01-16 DIAGNOSIS — N401 Enlarged prostate with lower urinary tract symptoms: Secondary | ICD-10-CM

## 2021-01-16 DIAGNOSIS — N138 Other obstructive and reflux uropathy: Secondary | ICD-10-CM

## 2021-02-03 ENCOUNTER — Ambulatory Visit
Admission: RE | Admit: 2021-02-03 | Discharge: 2021-02-03 | Disposition: A | Discharge status: Home / Self Care | Payer: Medicare Other | Source: Ambulatory Visit | Attending: Family Medicine | Admitting: Family Medicine

## 2021-02-03 ENCOUNTER — Other Ambulatory Visit: Payer: Self-pay

## 2021-02-03 DIAGNOSIS — K869 Disease of pancreas, unspecified: Secondary | ICD-10-CM | POA: Insufficient documentation

## 2021-02-03 IMAGING — MR MR ABDOMEN WO/W CM
18 of 20 series · 44 of 48 positions shown · IV contrast (gadavist)
Comparison: None.

CLINICAL DATA: Follow-up for pancreatic cystic lesions seen in the
tail the pancreas on prior CT abdomen and pelvis.

EXAM:
MRI ABDOMEN WITHOUT AND WITH CONTRAST
TECHNIQUE: Multiplanar multisequence MR imaging of the abdomen was performed
both before and after the administration of intravenous contrast.
CONTRAST:  9mL GADAVIST GADOBUTROL 1 MMOL/ML IV SOLN

[Series 3: cor haste · coronal · 6.0mm · 1.19mm/px · 2 of 32 slices shown]
[im 1/32]
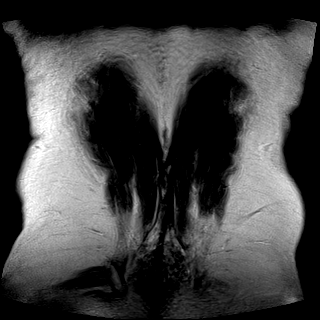
[im 32/32]
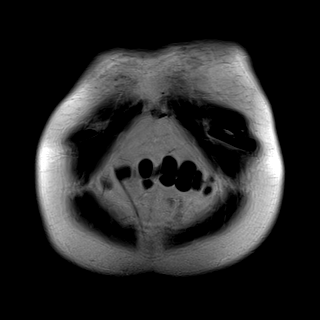

[Series 4: ax haste · axial · 6.0mm · 1.19mm/px · z∈[-215,+8]mm · 2 of 32 slices shown]
[im 1/32]
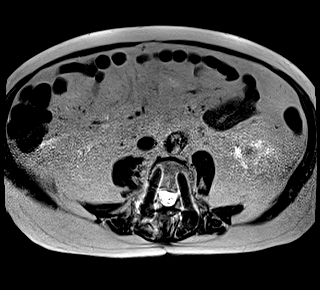
[im 32/32]
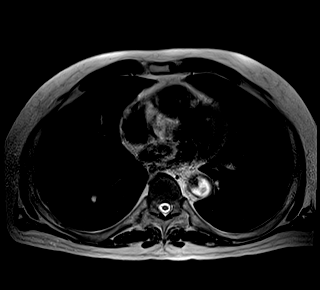

[Series 7: T2 fat-sat · axial · 6.0mm · 1.19mm/px · z∈[-217,+6]mm · 2 of 32 slices shown]
[im 1/32]
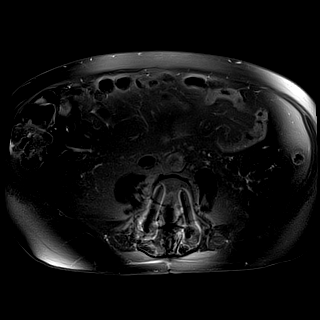
[im 32/32]
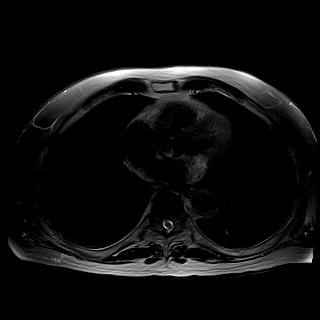

[Series 8: ax dwi_tracew · axial · 6.0mm · 1.42mm/px · z∈[-217,+6]mm · 4 of 96 slices shown]
[im 1/96]
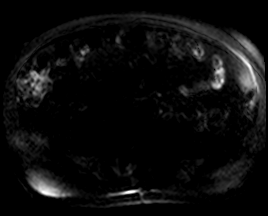
[im 32/96]
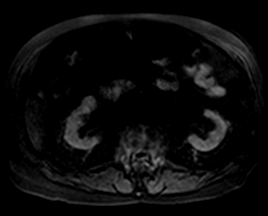
[im 64/96]
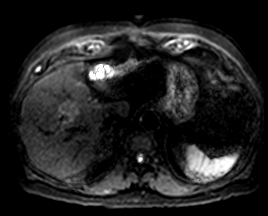
[im 96/96]
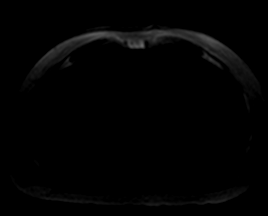

[Series 9: ax dwi_adc · axial · 6.0mm · 1.42mm/px · 1 of 32 slices shown]
[im 1/32]
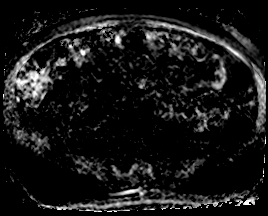

[Series 10: T1 · axial · 6.0mm · 0.74mm/px · 1 of 32 slices shown (1 of 2)]
[im 1/32]
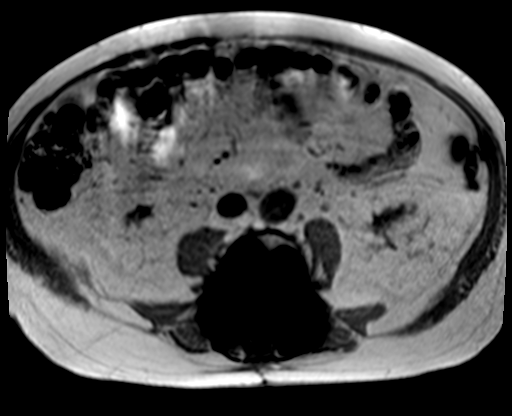

[Series 10: T1 · axial · 6.0mm · 0.74mm/px · 1 of 32 slices shown (2 of 2)]
[im 1/32]
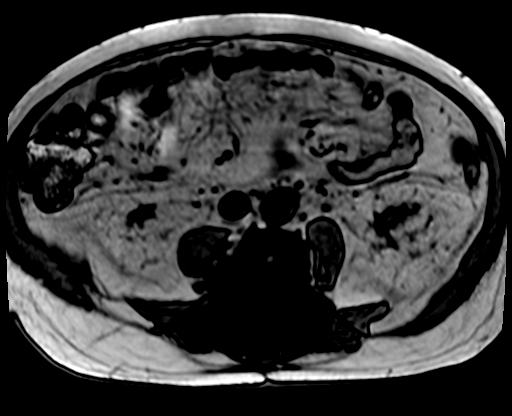

[Series 14: bSSFP · axial · 6.0mm · 0.74mm/px · 1 of 32 slices shown]
[im 1/32]
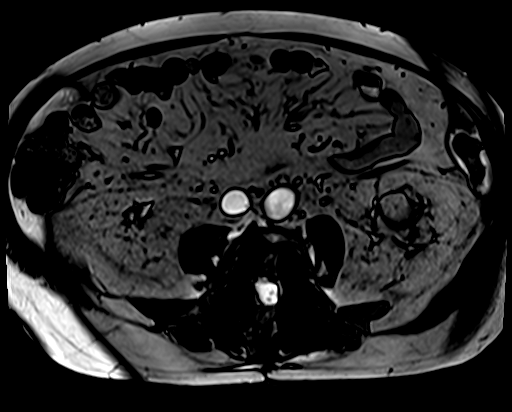

[Series 15: T1 dynamic fat-sat · axial · non-contrast · 3.0mm · 1.19mm/px · z∈[-212,+1]mm · 3 of 72 slices shown (1 of 5)]
[im 1/72]
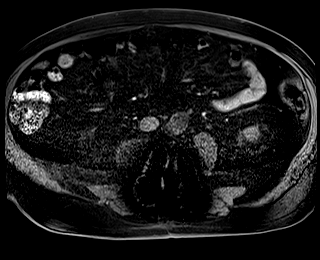
[im 36/72]
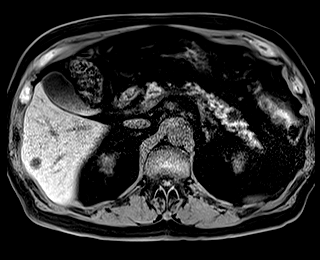
[im 72/72]
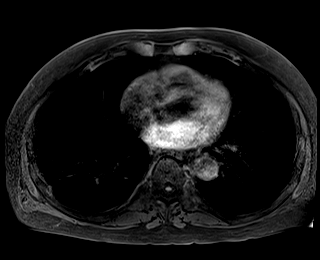

[Series 16: T1 dynamic fat-sat post-contrast · axial · 3.0mm · 1.19mm/px · z∈[-212,+1]mm · 3 of 72 slices shown (1 of 4)]
[im 1/72]
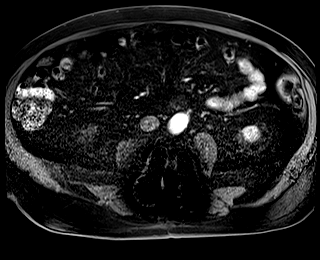
[im 36/72]
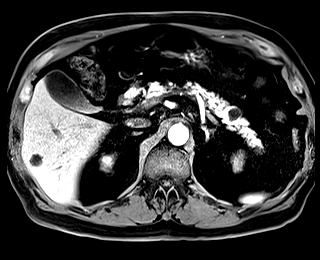
[im 72/72]
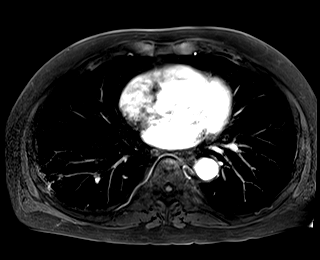

[Series 17: T1 dynamic fat-sat · axial · 3.0mm · 1.19mm/px · z∈[-212,+1]mm · 3 of 72 slices shown (2 of 5)]
[im 1/72]
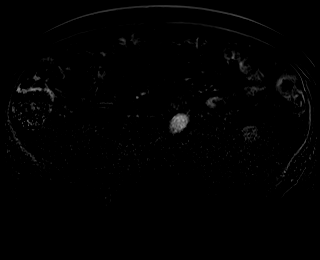
[im 36/72]
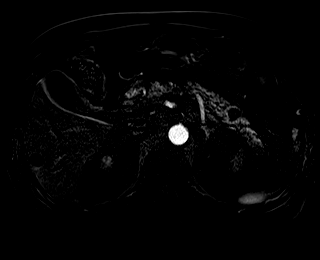
[im 72/72]
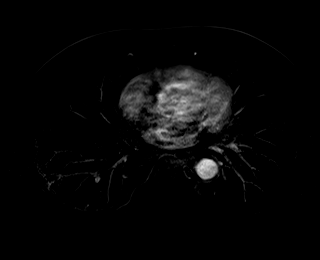

[Series 18: T1 dynamic fat-sat post-contrast · axial · 3.0mm · 1.19mm/px · z∈[-212,+1]mm · 3 of 72 slices shown (2 of 4)]
[im 1/72]
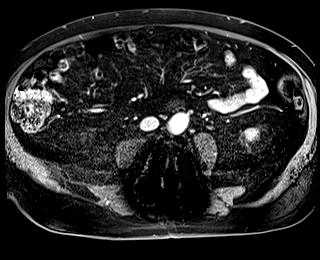
[im 36/72]
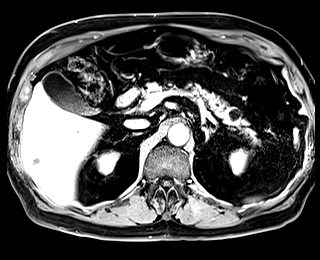
[im 72/72]
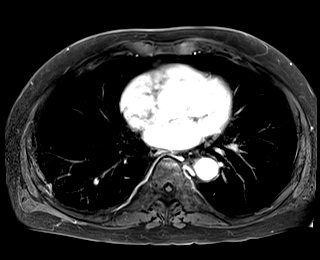

[Series 19: T1 dynamic fat-sat · axial · 3.0mm · 1.19mm/px · z∈[-212,+1]mm · 3 of 72 slices shown (3 of 5)]
[im 1/72]
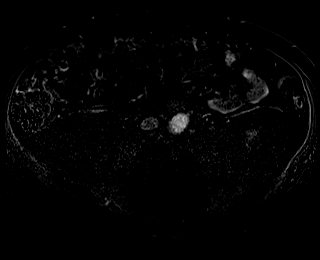
[im 36/72]
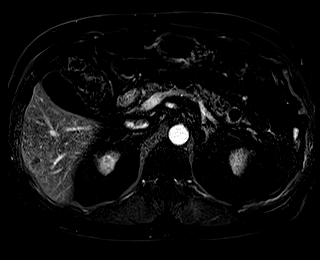
[im 72/72]
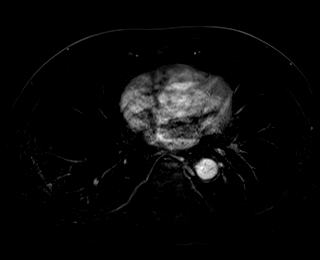

[Series 20: T1 dynamic fat-sat post-contrast · axial · 3.0mm · 1.19mm/px · z∈[-212,+1]mm · 3 of 72 slices shown (3 of 4)]
[im 1/72]
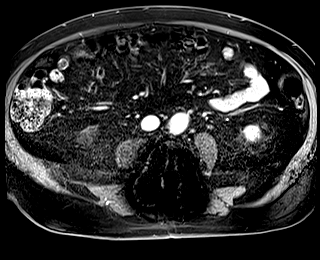
[im 36/72]
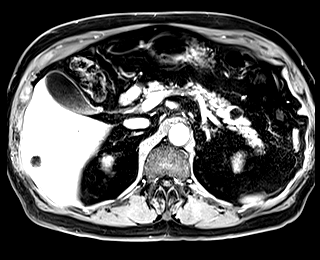
[im 72/72]
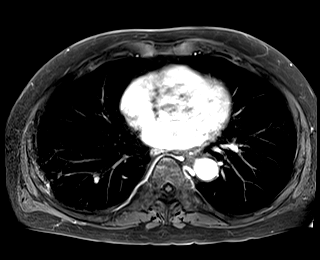

[Series 21: T1 dynamic fat-sat · axial · 3.0mm · 1.19mm/px · z∈[-212,+1]mm · 3 of 72 slices shown (4 of 5)]
[im 1/72]
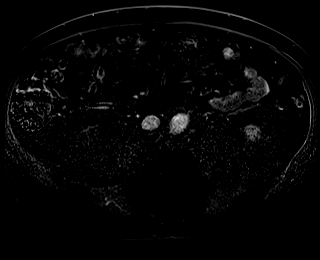
[im 36/72]
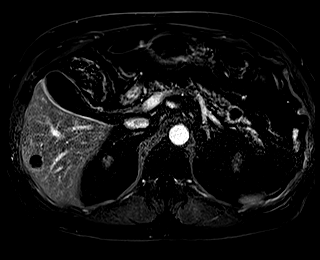
[im 72/72]
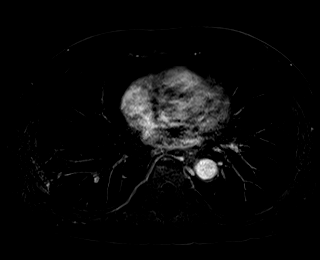

[Series 22: T1 dynamic post-contrast · coronal · 3.0mm · 1.31mm/px · 3 of 80 slices shown]
[im 1/80]
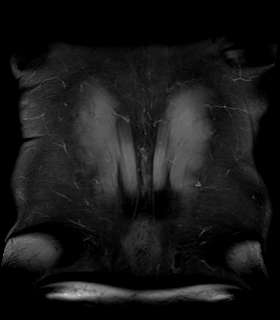
[im 40/80]
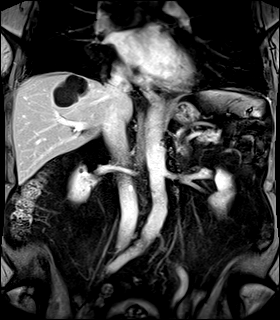
[im 80/80]
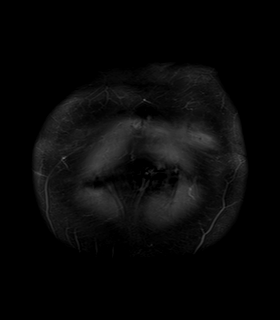

[Series 23: T1 dynamic fat-sat post-contrast · axial · 3.0mm · 1.19mm/px · z∈[-212,+1]mm · 3 of 72 slices shown (4 of 4)]
[im 1/72]
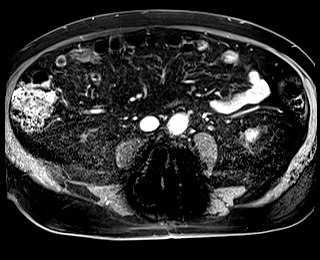
[im 36/72]
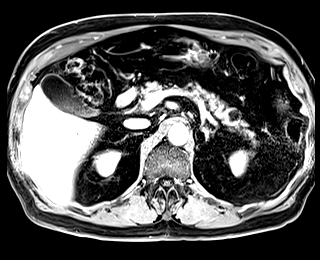
[im 72/72]
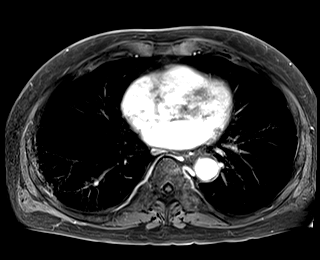

[Series 24: T1 dynamic fat-sat · axial · 3.0mm · 1.19mm/px · z∈[-212,+1]mm · 3 of 72 slices shown (5 of 5)]
[im 1/72]
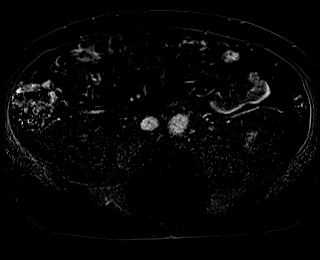
[im 36/72]
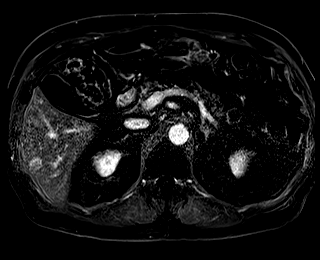
[im 72/72]
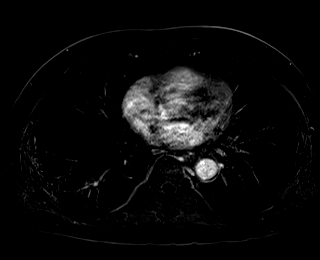

[44 of 48 positions shown; findings below may reference images not displayed]

FINDINGS: Lower chest: 8 mm pulmonary nodule in the right lung base consistent
with benign pulmonary hamartoma.

Hepatobiliary: Multiple hepatic cysts. The largest of which is in
the right lobe of the liver and measures 5.2 cm. No suspicious
enhancing hepatic lesions. Gallbladder is unremarkable. No biliary
ductal dilatation.

Pancreas: There are multiple pancreatic cystic lesions for reference
the 2 largest lesions measure 1.5 cm maximum diameter one of which
is in the pancreatic head/uncinate process (series 3, image 20). The
other 1.5 cm cyst is in the pancreatic tail and corresponds with the
lesion seen on prior CT (series 4, image 17). The other non index
pancreatic cysts measure 5 mm or smaller. No suspicious enhancing
features and no pancreatic ductal dilation.

Spleen:  Within normal limits in size and appearance.

Adrenals/Urinary Tract: Renal glands are unremarkable. Unchanged
size of the 2.7 cm exophytic right renal neoplasm (series 17, image
55). There are 2 other nonenhancing right renal cysts. The left
kidney is unremarkable. No hydronephrosis.

Stomach/Bowel: Visualized portions within the abdomen are
unremarkable.

Vascular/Lymphatic: No pathologically enlarged lymph nodes
identified. No abdominal aortic aneurysm demonstrated.

Other:  None.

Musculoskeletal: No suspicious bone lesions identified. Partially
visualized lumbar fusion hardware.
IMPRESSION: 1. Unchanged size of the exophytic 2.7 cm right renal neoplasm.
2. Multiple pancreatic cystic lesions measuring up to 1.5 cm,
without suspicious enhancement or pancreatic ductal dilatation. One
of which corresponds with the hypodense lesion seen on prior CT.
Findings are most consistent with side branch intraductal papillary
mucinous neoplasms. Recommend follow-up MRI in 1 year with MRCP to
ensure stability.
3. Multiple hepatic cysts.

## 2021-02-03 MED ORDER — GADOBUTROL 1 MMOL/ML IV SOLN
9.0000 mL | Freq: Once | INTRAVENOUS | Status: AC | PRN
  Administered 2021-02-03: 9 mL via INTRAVENOUS

## 2021-02-08 ENCOUNTER — Ambulatory Visit: Payer: Medicare Other | Admitting: Urology

## 2021-02-08 ENCOUNTER — Encounter: Payer: Self-pay | Admitting: Urology

## 2021-02-08 ENCOUNTER — Other Ambulatory Visit: Payer: Self-pay

## 2021-02-08 VITALS — BP 179/70 | HR 93 | Ht 69.0 in | Wt 190.0 lb

## 2021-02-08 DIAGNOSIS — N138 Other obstructive and reflux uropathy: Secondary | ICD-10-CM

## 2021-02-08 DIAGNOSIS — N401 Enlarged prostate with lower urinary tract symptoms: Secondary | ICD-10-CM

## 2021-02-08 DIAGNOSIS — R1032 Left lower quadrant pain: Secondary | ICD-10-CM

## 2021-02-08 DIAGNOSIS — G8929 Other chronic pain: Secondary | ICD-10-CM

## 2021-02-08 DIAGNOSIS — N2889 Other specified disorders of kidney and ureter: Secondary | ICD-10-CM

## 2021-02-08 LAB — URINALYSIS, COMPLETE
Bilirubin, UA: NEGATIVE
Glucose, UA: NEGATIVE
Ketones, UA: NEGATIVE
Leukocytes,UA: NEGATIVE
Nitrite, UA: NEGATIVE
Protein,UA: NEGATIVE
RBC, UA: NEGATIVE
Specific Gravity, UA: 1.02 (ref 1.005–1.030)
Urobilinogen, Ur: 0.2 mg/dL (ref 0.2–1.0)
pH, UA: 5 (ref 5.0–7.5)

## 2021-02-08 LAB — MICROSCOPIC EXAMINATION
Bacteria, UA: NONE SEEN
Epithelial Cells (non renal): NONE SEEN /hpf (ref 0–10)

## 2021-02-08 NOTE — Progress Notes (Signed)
02/08/2021 10:34 AM   Dakota Garza 1923/08/11 [id number]  Referring provider: Donnamarie Rossetti, PA-C [place],  [place] [address]  Chief Complaint  Patient presents with  . renal mass    HPI: 87 y.o. male followed for BPH presents for incidentally discovered renal mass.   Has a history of chronic recurrent left groin pain and saw his PCP who ordered a CT of the abdomen and pelvis performed 12/28/2020 which showed a 2.2 x 2.7 x 2.5 cm right upper pole renal mass anteriorly which was enhancing  He was incidentally noted to have cystic lesions in the pancreas and a MRI was performed 02/03/2021 which showed no significant size change of 2.7 cm  Denies flank or abdominal pain  Denies gross hematuria   PMH: Past Medical History:  Diagnosis Date  . Asthma in adult without complication   . BPH (benign prostatic hyperplasia)   . Glaucoma   . Hypothyroidism   . Oral cancer [facility])     Surgical History: Past Surgical History:  Procedure Laterality Date  . CYSTOSCOPY WITH INSERTION OF UROLIFT N/A 02/24/2019   Procedure: CYSTOSCOPY WITH INSERTION OF UROLIFT;  Surgeon: Abbie Sons, MD;  Location: [facility] ORS;  Service: Urology;  Laterality: N/A;  . Oral cancer Surgery  2012  . POSTERIOR FUSION SPINAL DEFORMITY  2015  . prostate vaporization  2012    Home Medications:  Allergies as of 02/08/2021      Reactions   Omeprazole    Other reaction(s): Abdominal pain      Medication List       Accurate as of February 08, 2021 10:34 AM. If you have any questions, ask your nurse or doctor.        albuterol 108 (90 Base) MCG/ACT inhaler Commonly known as: VENTOLIN HFA Inhale 2 puffs into the lungs every 6 (six) hours as needed for wheezing or shortness of breath.   ARTIFICIAL TEARS OP Place 1-2 drops into both eyes daily.   aspirin EC 81 MG tablet Take 81 mg by mouth daily.   budesonide-formoterol 160-4.5 MCG/ACT inhaler Commonly known as:  SYMBICORT Inhale 2 puffs into the lungs 2 (two) times daily.   CoQ10 100 MG Caps Take 100 mg by mouth daily.   finasteride 5 MG tablet Commonly known as: PROSCAR Take 1 tablet (5 mg total) by mouth daily.   gabapentin 100 MG capsule Commonly known as: NEURONTIN TAKE 2 CAPSULES BY MOUTH TWICE A DAY   ipratropium 0.03 % nasal spray Commonly known as: ATROVENT Place 1 spray into both nostrils 2 (two) times daily.   levothyroxine 75 MCG tablet Commonly known as: SYNTHROID Take 75 mcg by mouth daily before breakfast.   metoprolol tartrate 25 MG tablet Commonly known as: LOPRESSOR Take 12.5 mg by mouth 2 (two) times daily.   montelukast 10 MG tablet Commonly known as: SINGULAIR Take 10 mg by mouth at bedtime.   multivitamin tablet Take 1 tablet by mouth daily.   polyethylene glycol 17 g packet Commonly known as: MIRALAX / GLYCOLAX Take 17 g by mouth daily as needed for moderate constipation.   pravastatin 40 MG tablet Commonly known as: PRAVACHOL Take 40 mg by mouth every evening.   PRESERVISION AREDS 2 PO Take 1 capsule by mouth daily.   PROBIOTIC PO Take 1 tablet by mouth daily.   tamsulosin 0.4 MG Caps capsule Commonly known as: FLOMAX TAKE 1 CAPSULE BY MOUTH  DAILY       Allergies:  Allergies  Allergen Reactions  . Omeprazole     Other reaction(s): Abdominal pain    Family History: Family History  Problem Relation Age of Onset  . Coronary artery disease Mother   . Alzheimer's disease Father   . Coronary artery disease Sister   . Kidney disease Neg Hx   . Prostate cancer Neg Hx     Social History:  reports that he has never smoked. He has never used smokeless tobacco. He reports current alcohol use. He reports that he does not use drugs.   Physical Exam: BP (!) 179/70   Pulse 93   Ht 5\' 9"  (1.753 m)   Wt 190 lb (86.2 kg)   BMI 28.06 kg/m   Constitutional:  Alert and oriented, No acute distress. HEENT: [place] AT, moist mucus membranes.   Trachea midline, no masses. Cardiovascular: No clubbing, cyanosis, or edema. Respiratory: Normal respiratory effort, no increased work of breathing. GU: Testes descended bilaterally.  Mild left testicular tenderness.  Testes atrophic bilaterally   Pertinent Imaging: CT images were personally reviewed and interpreted   Assessment & Plan:    1.  Right renal mass  <4 cm enhancing right renal mass  Discussed with [name] and his daughter that the small enhancing lesions indicate renal cell carcinoma ~75% of the time A solid renal mass raises the suspicion of primary renal malignancy.  We discussed this in detail and in regards to the spectrum of renal masses which includes cysts (pure cysts are considered benign), solid masses and everything in between. The risk of metastasis increases as the size of solid renal mass increases. In general, it is believed that the risk of metastasis for renal masses less than 3-4 cm is small (up to approximately 5%) based mainly on large retrospective studies. In some cases and especially in patients of older age and multiple comorbidities a surveillance approach may be appropriate. The treatment of solid renal masses includes: surveillance, cryoablation (percutaneous and laparoscopic) in addition to partial and complete nephrectomy (each with option of laparoscopic, robotic and open depending on appropriateness). Furthermore, nephrectomy appears to be an independent risk factor for the development of chronic kidney disease suggesting that nephron sparing approaches should be implored whenever feasible. We reviewed these options in context of the patients current situation as well as the pros and cons of each.  He has initially elected surveillance and will schedule follow-up imaging in 6 months  2.  Chronic left groin pain  Mild testicular tenderness.  We discussed that scrotal/groin pain is very common and in most cases a definite etiology cannot be  identified.  Most likely neuropathic pain.  He is on gabapentin and I increase the dose approximately 2 years ago and he noted no significant improvement  3.  BPH with LUTS  Status post UroLift with stable voiding symptoms   Abbie Sons, MD  [place] [address], [place], [place] [address] [phone]

## 2021-07-24 ENCOUNTER — Other Ambulatory Visit: Payer: Self-pay

## 2021-07-24 ENCOUNTER — Ambulatory Visit
Admission: RE | Admit: 2021-07-24 | Discharge: 2021-07-24 | Disposition: A | Discharge status: Home / Self Care | Payer: Medicare Other | Source: Ambulatory Visit | Attending: Urology | Admitting: Urology

## 2021-07-24 DIAGNOSIS — N2889 Other specified disorders of kidney and ureter: Secondary | ICD-10-CM | POA: Insufficient documentation

## 2021-07-24 IMAGING — MR MR ABDOMEN W/O CM
9 series · 48 of 48 positions shown · non-contrast
Comparison: MRI [DATE] and CT [DATE]

CLINICAL DATA: History of right renal neoplasm, follow-up MRI
[DATE].

EXAM:
MRI ABDOMEN WITHOUT CONTRAST
TECHNIQUE: Multiplanar multisequence MR imaging was performed without the
administration of intravenous contrast.

[Series 3: T2 · coronal · 6.0mm · 1.19mm/px · 2 of 32 slices shown (1 of 2)]
[im 1/32]
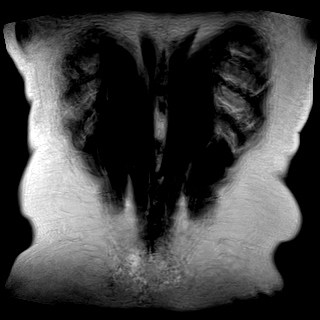
[im 32/32]
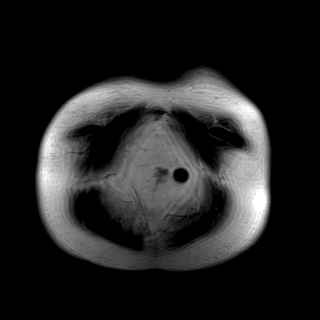

[Series 4: T2 · axial · 6.0mm · 1.19mm/px · z∈[-138,+99]mm · 3 of 34 slices shown (2 of 2)]
[im 1/34]
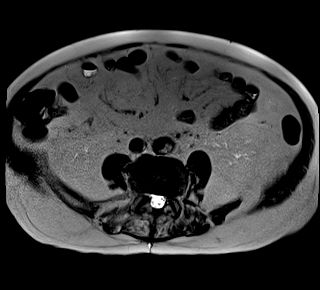
[im 17/34]
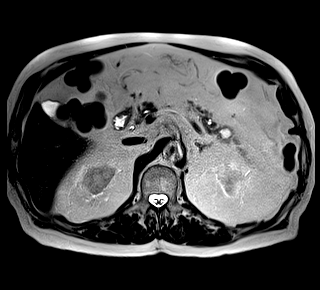
[im 34/34]
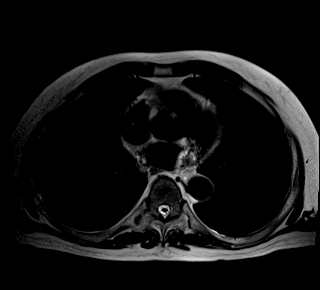

[Series 6: T2 fat-sat · axial · 6.0mm · 1.19mm/px · z∈[-138,+99]mm · 3 of 34 slices shown]
[im 1/34]
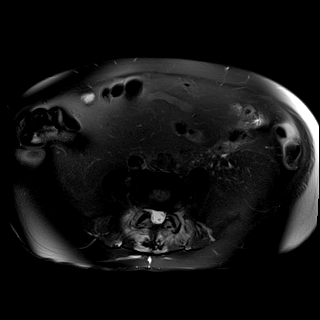
[im 17/34]
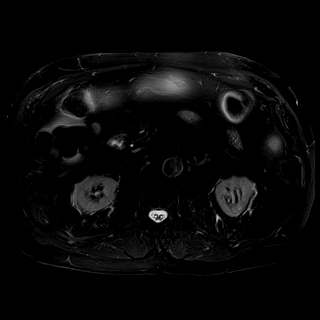
[im 34/34]
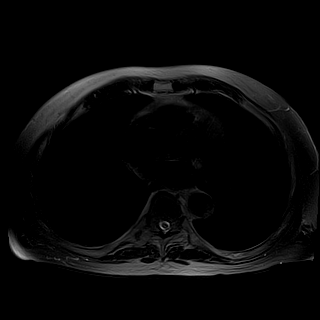

[Series 7: in & out · axial · 3.0mm · 1.19mm/px · z∈[-138,+99]mm · 8 of 80 slices shown (1 of 2)]
[im 1/80]
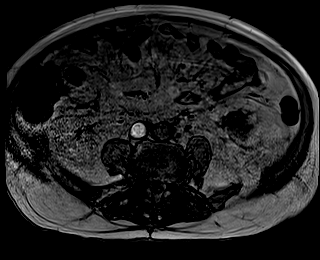
[im 12/80]
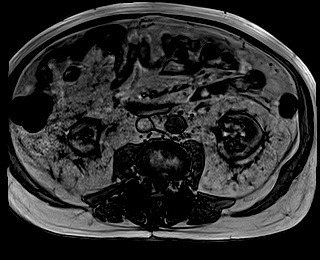
[im 23/80]
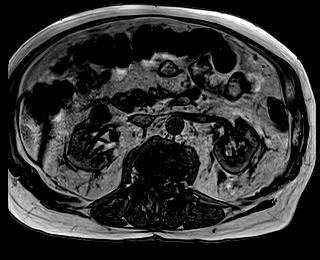
[im 34/80]
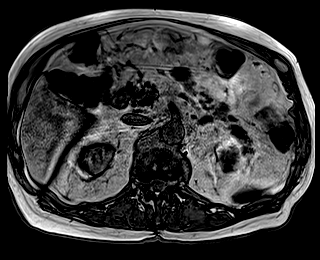
[im 46/80]
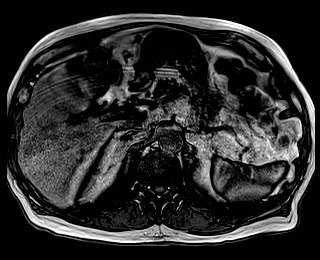
[im 57/80]
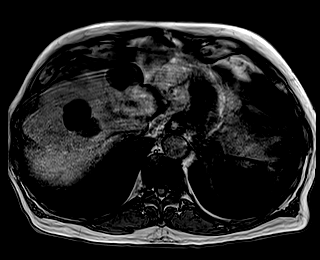
[im 68/80]
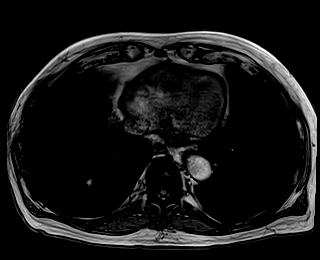
[im 80/80]
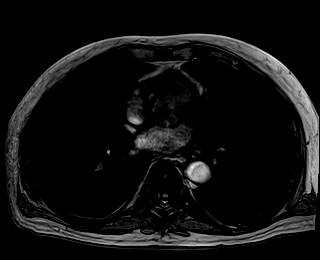

[Series 8: in & out · axial · 3.0mm · 1.19mm/px · z∈[-138,+99]mm · 8 of 80 slices shown (2 of 2)]
[im 1/80]
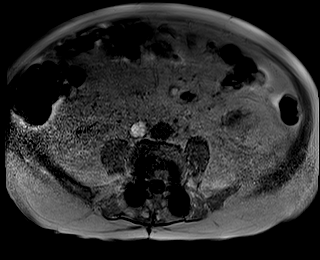
[im 12/80]
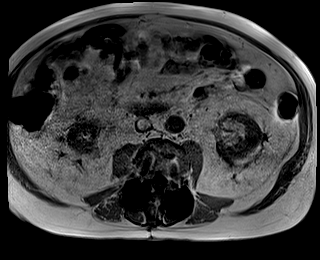
[im 23/80]
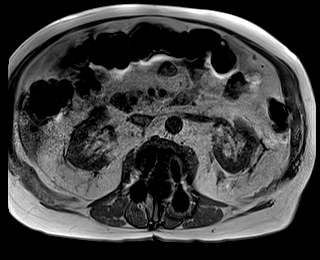
[im 34/80]
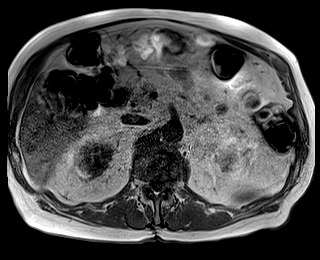
[im 46/80]
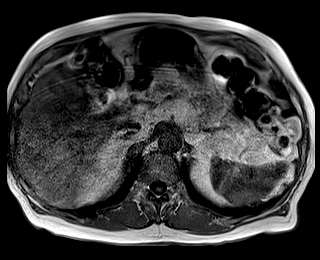
[im 57/80]
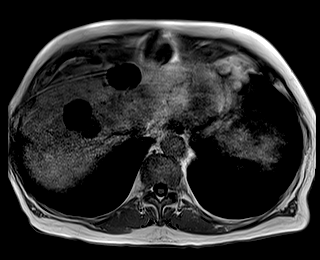
[im 68/80]
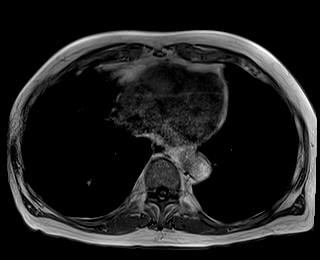
[im 80/80]
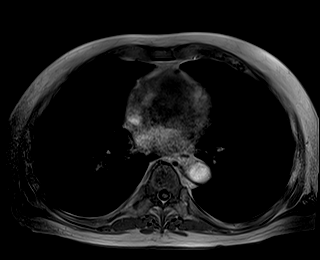

[Series 9: bSSFP · axial · 6.0mm · 0.74mm/px · z∈[-138,+99]mm · 3 of 34 slices shown]
[im 1/34]
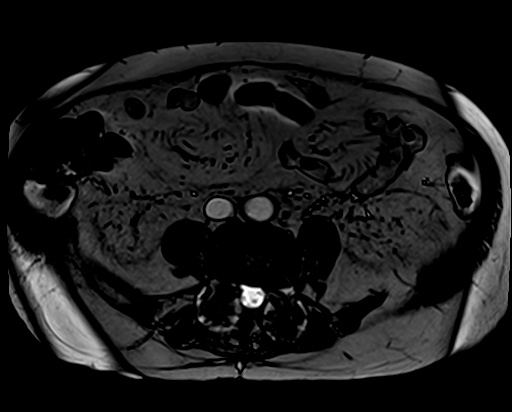
[im 17/34]
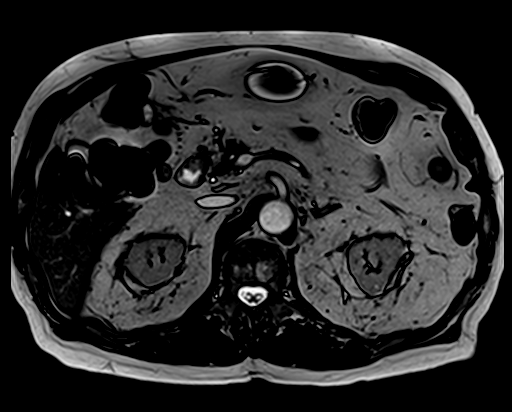
[im 34/34]
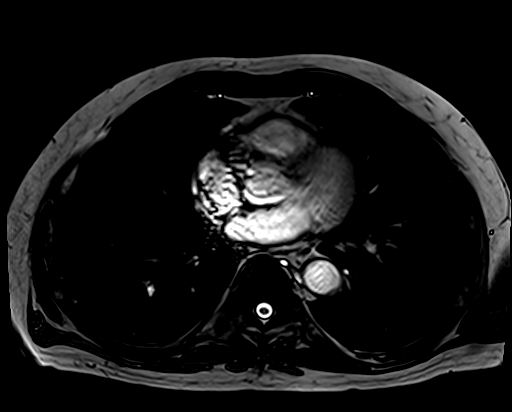

[Series 10: ax dwi_tracew · axial · 6.0mm · 1.42mm/px · z∈[-138,+99]mm · 10 of 102 slices shown]
[im 1/102]
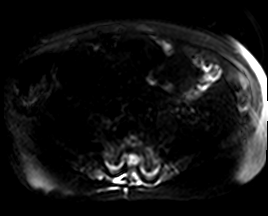
[im 12/102]
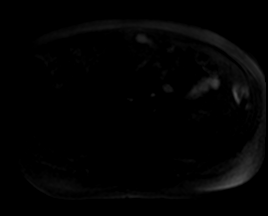
[im 23/102]
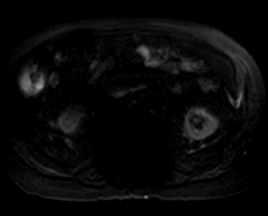
[im 34/102]
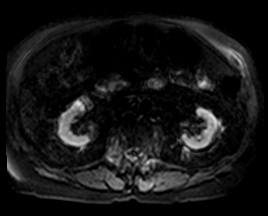
[im 45/102]
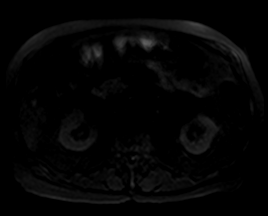
[im 57/102]
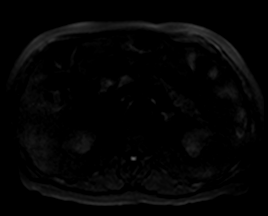
[im 68/102]
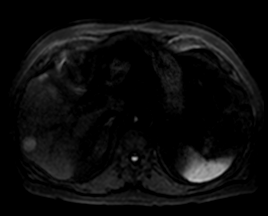
[im 79/102]
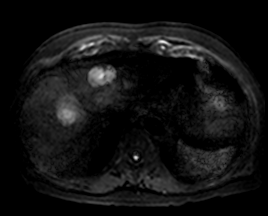
[im 90/102]
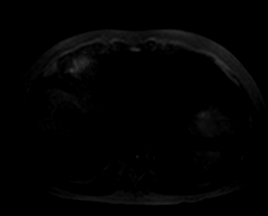
[im 102/102]
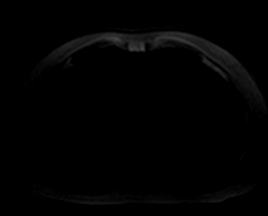

[Series 11: ax dwi_adc · axial · 6.0mm · 1.42mm/px · z∈[-138,+99]mm · 3 of 34 slices shown]
[im 1/34]
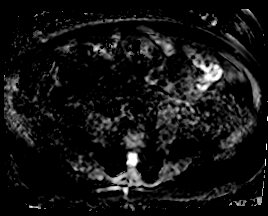
[im 17/34]
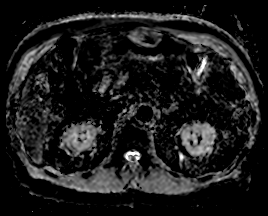
[im 34/34]
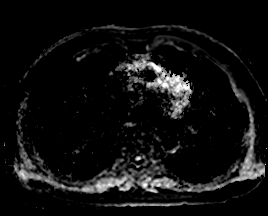

[Series 12: T1 dynamic fat-sat · axial · 3.0mm · 1.19mm/px · z∈[-138,+99]mm · 8 of 80 slices shown]
[im 1/80]
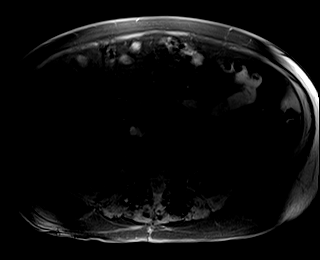
[im 12/80]
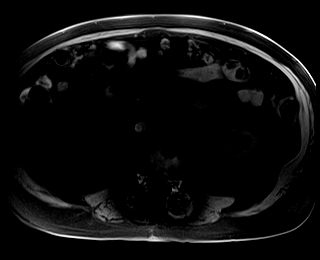
[im 23/80]
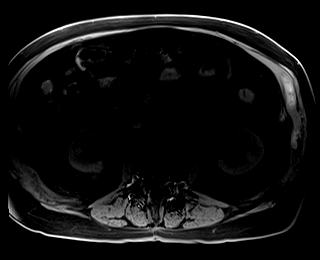
[im 34/80]
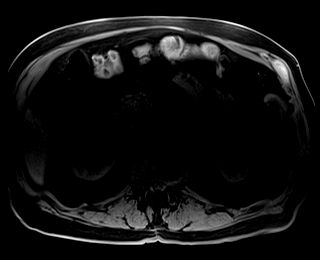
[im 46/80]
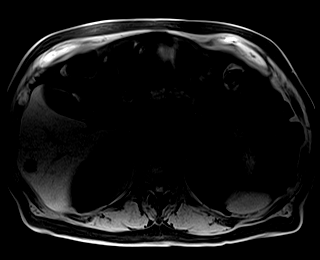
[im 57/80]
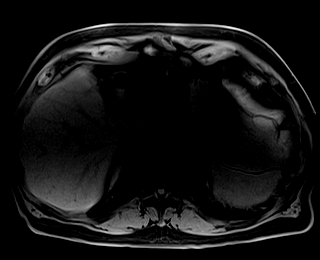
[im 68/80]
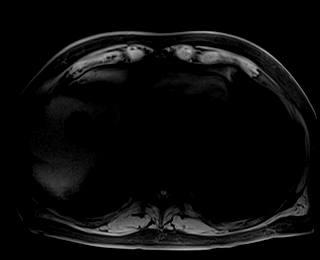
[im 80/80]
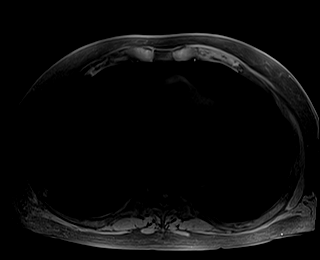

[48 of 48 positions shown; findings below may reference images not displayed]

FINDINGS: Lower chest: Stable 7 mm right lower lobe pulmonary hamartoma. No
acute abnormality.

Hepatobiliary: Bilobar hepatic cysts measuring up to 5 cm in the
right lobe of the liver on image [DATE]. Gallbladder is unremarkable.
No biliary ductal dilation.

Pancreas: Multiple cystic pancreatic lesions are again seen with the
2 largest lesions measuring 1.5 cm in maximum diameter 1 of which is
in the pancreatic head/uncinate process on image [DATE] with the other
1.5 cm lesion in the tail on image [DATE], both of which are unchanged
in size. The additional non index pancreatic cysts measure 5 mm or
smaller and are also unchanged in size. No pancreatic ductal
dilation.

Spleen:  Within normal limits.

Adrenals/Urinary Tract:  Bilateral adrenal glands are unremarkable.

Similar size of the exophytic right interpolar renal lesion, which
demonstrated postcontrast enhancement on previous contrast enhanced
MRI, an now measures 2.6 cm on image [DATE], previously 2.7 cm. Two
additional right renal cyst. Left kidney is unremarkable. No
hydronephrosis.

Stomach/Bowel: Visualized portions within the abdomen are
unremarkable.

Vascular/Lymphatic: No pathologically enlarged lymph nodes
identified. No abdominal aortic aneurysm demonstrated.

Other:  No abdominopelvic ascites.

Musculoskeletal: No suspicious bone lesions identified.
IMPRESSION: 1. Stable size of the 2.6 cm exophytic right interpolar renal
neoplasm.
2. Similar appearance of multiple cystic pancreatic lesions
measuring up to 1.5 cm, but incompletely evaluated without
intravenous contrast material. No pancreatic ductal dilation.
Recommend follow up pre and post contrast MRI/MRCP or pancreatic
protocol CT in 1 year. This recommendation follows ACR consensus
guidelines: Management of Incidental Pancreatic Cysts: A White Paper
of the ACR Incidental Findings Committee. [HOSPITAL]

## 2021-07-31 ENCOUNTER — Ambulatory Visit: Payer: Self-pay | Admitting: Urology

## 2021-08-07 ENCOUNTER — Ambulatory Visit: Payer: Medicare Other | Admitting: Urology

## 2021-08-07 ENCOUNTER — Other Ambulatory Visit: Payer: Self-pay

## 2021-08-07 ENCOUNTER — Encounter: Payer: Self-pay | Admitting: Urology

## 2021-08-07 VITALS — BP 138/78 | HR 92 | Ht 69.0 in | Wt 186.4 lb

## 2021-08-07 DIAGNOSIS — R1032 Left lower quadrant pain: Secondary | ICD-10-CM

## 2021-08-07 DIAGNOSIS — N138 Other obstructive and reflux uropathy: Secondary | ICD-10-CM | POA: Diagnosis not present

## 2021-08-07 DIAGNOSIS — N401 Enlarged prostate with lower urinary tract symptoms: Secondary | ICD-10-CM

## 2021-08-07 DIAGNOSIS — N2889 Other specified disorders of kidney and ureter: Secondary | ICD-10-CM | POA: Diagnosis not present

## 2021-08-07 DIAGNOSIS — G8929 Other chronic pain: Secondary | ICD-10-CM

## 2021-08-07 NOTE — Progress Notes (Signed)
08/07/2021 1:09 PM   Dakota Garza [birth year] [id number]  Referring provider: Donnamarie Rossetti, PA-C [address] [place],  [place] [address]  Chief Complaint  Patient presents with   Follow-up    Urologic history: 1.  BPH with LUTS PVP 2012 Worsening voiding symptoms IPSS 20/35 UroLift 02/24/2019 Persistent storage related voiding symptoms post procedure UDS consistent with outlet obstruction Elected medical management  2.  Right renal mass Incidental 2.2 x 2.7 x 2.5 cm right upper pole mass on CT 12/28/2020 Elected surveillance  3.  Chronic left groin pain   HPI: 87 y.o. male presents for follow-up.  Since his last visit he has done fairly well Denies flank pain or gross hematuria Voiding pattern stable on tamsulosin/finasteride Renal mass protocol MRI 07/24/2021 with a stable 2.6 cm enhancing right upper pole renal mass Continues with intermittent left groin pain   PMH: Past Medical History:  Diagnosis Date   Asthma in adult without complication    BPH (benign prostatic hyperplasia)    Glaucoma    Hypothyroidism    Oral cancer ([place])     Surgical History: Past Surgical History:  Procedure Laterality Date   CYSTOSCOPY WITH INSERTION OF UROLIFT N/A 02/24/2019   Procedure: CYSTOSCOPY WITH INSERTION OF UROLIFT;  Surgeon: Dakota Sons, MD;  Location: [facility] ORS;  Service: Urology;  Laterality: N/A;   Oral cancer Surgery  2012   POSTERIOR FUSION SPINAL DEFORMITY  2015   prostate vaporization  2012    Home Medications:  Allergies as of 08/07/2021       Reactions   Influenza Vaccines Hives   Omeprazole    Other reaction(s): Abdominal pain        Medication List        Accurate as of August 07, 2021  1:09 PM. If you have any questions, ask your nurse or doctor.          albuterol 108 (90 Base) MCG/ACT inhaler Commonly known as: VENTOLIN HFA Inhale 2 puffs into the lungs every 6 (six) hours as needed for wheezing or shortness of  breath.   ARTIFICIAL TEARS OP Place 1-2 drops into both eyes daily.   aspirin EC 81 MG tablet Take 81 mg by mouth daily.   budesonide-formoterol 160-4.5 MCG/ACT inhaler Commonly known as: SYMBICORT Inhale 2 puffs into the lungs 2 (two) times daily.   CoQ10 100 MG Caps Take 100 mg by mouth daily.   finasteride 5 MG tablet Commonly known as: PROSCAR Take 1 tablet (5 mg total) by mouth daily.   gabapentin 300 MG capsule Commonly known as: NEURONTIN Take 1 capsule by mouth 2 (two) times daily. What changed: Another medication with the same name was removed. Continue taking this medication, and follow the directions you see here. Changed by: Dakota Sons, MD   ipratropium 0.03 % nasal spray Commonly known as: ATROVENT Place 1 spray into both nostrils 2 (two) times daily.   ketoconazole 2 % shampoo Commonly known as: NIZORAL Apply topically.   levothyroxine 75 MCG tablet Commonly known as: SYNTHROID Take 75 mcg by mouth daily before breakfast.   metoprolol tartrate 25 MG tablet Commonly known as: LOPRESSOR Take 12.5 mg by mouth 2 (two) times daily.   montelukast 10 MG tablet Commonly known as: SINGULAIR Take 10 mg by mouth at bedtime.   multivitamin tablet Take 1 tablet by mouth daily.   polyethylene glycol 17 g packet Commonly known as: MIRALAX / GLYCOLAX Take 17 g by mouth daily as  needed for moderate constipation.   pravastatin 40 MG tablet Commonly known as: PRAVACHOL Take 40 mg by mouth every evening.   PRESERVISION AREDS 2 PO Take 1 capsule by mouth daily.   PROBIOTIC PO Take 1 tablet by mouth daily.   tamsulosin 0.4 MG Caps capsule Commonly known as: FLOMAX TAKE 1 CAPSULE BY MOUTH  DAILY   triamcinolone cream 0.1 % Commonly known as: KENALOG SMARTSIG:1 Application Topical 2-3 Times Daily        Allergies:  Allergies  Allergen Reactions   Influenza Vaccines Hives   Omeprazole     Other reaction(s): Abdominal pain    Family  History: Family History  Problem Relation Age of Onset   Coronary artery disease Mother    Alzheimer's disease Father    Coronary artery disease Sister    Kidney disease Neg Hx    Prostate cancer Neg Hx     Social History:  reports that he has never smoked. He has never used smokeless tobacco. He reports current alcohol use. He reports that he does not use drugs.   Physical Exam: BP 138/78 (BP Location: Left Arm, Patient Position: Sitting, Cuff Size: Normal)   Pulse 92   Ht '5\' 9"'$  (1.753 m)   Wt 186 lb 6.4 oz (84.6 kg)   BMI 27.53 kg/m   Constitutional:  Alert and oriented, No acute distress. HEENT: [place] AT, moist mucus membranes.  Trachea midline, no masses. Cardiovascular: No clubbing, cyanosis, or edema. Respiratory: Normal respiratory effort, no increased work of breathing. Psychiatric: Normal mood and affect.   Assessment & Plan:    1.  BPH with LUTS Stable on tamsulosin/finasteride Desires to continue medical management  2.  Right renal mass Stable on recent MRI This is an enhancing mass and discussed with Dakota Garza and his daughter there is a ~ 75% chance that this is a small renal cell carcinoma but with surveillance and stability the chances of metastatic disease are low He desires to continue surveillance.  He is also followed by oncology for pancreatic cysts.  If he needs continued MRI surveillance of pancreatic cyst this will also suffice for his renal mass surveillance.  If he no longer requires MRI monitoring of his pancreatic cyst we will schedule a renal ultrasound in 9 months  Dakota Sons, MD  [place] [facility] [address] [place], [place] [address] [phone]

## 2021-08-09 ENCOUNTER — Ambulatory Visit: Payer: Medicare Other | Admitting: Urology

## 2021-12-12 ENCOUNTER — Encounter: Payer: Self-pay | Admitting: Urology

## 2022-01-17 ENCOUNTER — Ambulatory Visit: Payer: Medicare Other | Admitting: Dermatology

## 2022-01-31 ENCOUNTER — Ambulatory Visit: Payer: Medicare Other | Admitting: Dermatology

## 2022-01-31 ENCOUNTER — Other Ambulatory Visit: Payer: Self-pay

## 2022-01-31 DIAGNOSIS — L570 Actinic keratosis: Secondary | ICD-10-CM | POA: Diagnosis not present

## 2022-01-31 DIAGNOSIS — L820 Inflamed seborrheic keratosis: Secondary | ICD-10-CM

## 2022-01-31 DIAGNOSIS — Z1283 Encounter for screening for malignant neoplasm of skin: Secondary | ICD-10-CM | POA: Diagnosis not present

## 2022-01-31 DIAGNOSIS — Z85828 Personal history of other malignant neoplasm of skin: Secondary | ICD-10-CM

## 2022-01-31 DIAGNOSIS — L821 Other seborrheic keratosis: Secondary | ICD-10-CM

## 2022-01-31 DIAGNOSIS — L814 Other melanin hyperpigmentation: Secondary | ICD-10-CM

## 2022-01-31 DIAGNOSIS — L219 Seborrheic dermatitis, unspecified: Secondary | ICD-10-CM

## 2022-01-31 DIAGNOSIS — L578 Other skin changes due to chronic exposure to nonionizing radiation: Secondary | ICD-10-CM | POA: Diagnosis not present

## 2022-01-31 DIAGNOSIS — D229 Melanocytic nevi, unspecified: Secondary | ICD-10-CM

## 2022-01-31 MED ORDER — KETOCONAZOLE 2 % EX SHAM: 1.0000 "application " | MEDICATED_SHAMPOO | CUTANEOUS | 11 refills | Status: AC

## 2022-01-31 MED ORDER — HYDROCORTISONE 2.5 % EX LOTN: TOPICAL_LOTION | CUTANEOUS | 6 refills | Status: AC

## 2022-01-31 NOTE — Progress Notes (Signed)
New Patient Visit  Subjective  Dakota Garza is a 88 y.o. male who presents for the following: Seborrheic Dermatitis (Scalp, ears, ketoconazole 2% shampoo prn) and Upper body skin exam. The patient presents for Upper Body Skin Exam (UBSE) for skin cancer screening and mole check.  The patient has spots, moles and lesions to be evaluated, some may be new or changing and the patient has concerns that these could be cancer.   The following portions of the chart were reviewed this encounter and updated as appropriate:   Tobacco   Allergies   Meds   Problems   Med Hx   Surg Hx   Fam Hx      Review of Systems:  No other skin or systemic complaints except as noted in HPI or Assessment and Plan.  Objective  Well appearing patient in no apparent distress; mood and affect are within normal limits.  A full examination was performed including scalp, head, eyes, ears, nose, lips, neck, chest, axillae, abdomen, back, buttocks, bilateral upper extremities, bilateral lower extremities, hands, feet, fingers, toes, fingernails, and toenails. All findings within normal limits unless otherwise noted below.  scalp, ears Pink patches with greasy scale.   Left Ear helix x 1 Crusted pap   R ant scalp x 1, chest sternum x 1 (2) Stuck on waxy paps with erythema    Assessment & Plan   Lentigines - Scattered tan macules - Due to sun exposure - Benign-appearing, observe - Recommend daily broad spectrum sunscreen SPF 30+ to sun-exposed areas, reapply every 2 hours as needed. - Call for any changes  Seborrheic Keratoses - Stuck-on, waxy, tan-brown papules and/or plaques  - Benign-appearing - Discussed benign etiology and prognosis. - Observe - Call for any changes  Melanocytic Nevi - Tan-brown and/or pink-flesh-colored symmetric macules and papules - Benign appearing on exam today - Observation - Call clinic for new or changing moles - Recommend daily use of broad spectrum spf 30+ sunscreen to  sun-exposed areas.   Actinic Damage - Chronic condition, secondary to cumulative UV/sun exposure - diffuse scaly erythematous macules with underlying dyspigmentation - Recommend daily broad spectrum sunscreen SPF 30+ to sun-exposed areas, reapply every 2 hours as needed.  - Staying in the shade or wearing long sleeves, sun glasses (UVA+UVB protection) and wide brim hats (4-inch brim around the entire circumference of the hat) are also recommended for sun protection.  - Call for new or changing lesions.  Skin cancer screening performed today.  History of Skin Cancer  Clear. Observe for recurrence.  Call clinic for new or changing lesions.   Recommend regular skin exams, daily broad-spectrum spf 30+ sunscreen use, and photoprotection.      Seborrheic dermatitis scalp, ears  Seborrheic Dermatitis  -  is a chronic persistent rash characterized by pinkness and scaling most commonly of the mid face but also can occur on the scalp (dandruff), ears; mid chest, mid back and groin.  It tends to be exacerbated by stress and cooler weather.  People who have neurologic disease may experience new onset or exacerbation of existing seborrheic dermatitis.  The condition is not curable but treatable and can be controlled.  Cont Ketoconazole 2% shampoo 3x/wk, let sit 5 minutes and rinse out Start HC 2.5% lotion up to 3 days a week  Topical steroids (such as triamcinolone, fluocinolone, fluocinonide, mometasone, clobetasol, halobetasol, betamethasone, hydrocortisone) can cause thinning and lightening of the skin if they are used for too long in the same area. Your physician  has selected the right strength medicine for your problem and area affected on the body. Please use your medication only as directed by your physician to prevent side effects.    ketoconazole (NIZORAL) 2 % shampoo - scalp, ears Apply 1 application topically 3 (three) times a week. Wash scalp 3 times weekly, let sit 5 minutes and rinse  out  hydrocortisone 2.5 % lotion - scalp, ears Apply topically 3 (three) times a week. Apply to scaly areas in ears 3 nights per week prn flares  AK (actinic keratosis) Left Ear helix x 1  Destruction of lesion - Left Ear helix x 1 Complexity: simple   Destruction method: cryotherapy   Informed consent: discussed and consent obtained   Timeout:  patient name, date of birth, surgical site, and procedure verified Lesion destroyed using liquid nitrogen: Yes   Region frozen until ice ball extended beyond lesion: Yes   Outcome: patient tolerated procedure well with no complications   Post-procedure details: wound care instructions given    Inflamed seborrheic keratosis (2) R ant scalp x 1, chest sternum x 1  Destruction of lesion - R ant scalp x 1, chest sternum x 1 Complexity: simple   Destruction method: cryotherapy   Informed consent: discussed and consent obtained   Timeout:  patient name, date of birth, surgical site, and procedure verified Lesion destroyed using liquid nitrogen: Yes   Region frozen until ice ball extended beyond lesion: Yes   Outcome: patient tolerated procedure well with no complications   Post-procedure details: wound care instructions given    Skin cancer screening   Return in about 3 months (around 04/30/2022), or recheck Ak L ear helix, recheck ISks.  I, Othelia Pulling, RMA, am acting as scribe for Sarina Ser, MD . Documentation: I have reviewed the above documentation for accuracy and completeness, and I agree with the above.  Sarina Ser, MD

## 2022-01-31 NOTE — Patient Instructions (Signed)

## 2022-02-03 ENCOUNTER — Encounter: Payer: Self-pay | Admitting: Dermatology

## 2022-02-12 ENCOUNTER — Other Ambulatory Visit: Payer: Self-pay | Admitting: Urology

## 2022-02-12 DIAGNOSIS — N2889 Other specified disorders of kidney and ureter: Secondary | ICD-10-CM

## 2022-02-14 ENCOUNTER — Other Ambulatory Visit: Payer: Self-pay | Admitting: Urology

## 2022-02-14 DIAGNOSIS — N138 Other obstructive and reflux uropathy: Secondary | ICD-10-CM

## 2022-04-17 ENCOUNTER — Other Ambulatory Visit: Payer: Self-pay | Admitting: Physical Medicine & Rehabilitation

## 2022-04-17 DIAGNOSIS — G8929 Other chronic pain: Secondary | ICD-10-CM

## 2022-04-26 ENCOUNTER — Ambulatory Visit
Admission: RE | Admit: 2022-04-26 | Discharge: 2022-04-26 | Disposition: A | Discharge status: Home / Self Care | Payer: Medicare Other | Source: Ambulatory Visit | Attending: Physical Medicine & Rehabilitation | Admitting: Physical Medicine & Rehabilitation

## 2022-04-26 DIAGNOSIS — M5441 Lumbago with sciatica, right side: Secondary | ICD-10-CM | POA: Insufficient documentation

## 2022-04-26 DIAGNOSIS — G8929 Other chronic pain: Secondary | ICD-10-CM | POA: Insufficient documentation

## 2022-04-26 IMAGING — MR MR LUMBAR SPINE W/O CM
5 series · 31 of 48 positions shown · non-contrast
Comparison: [DATE]

CLINICAL DATA: Fall out of bed 1 week ago. Right greater than left
lower back pain. Right leg pain

EXAM:
MRI LUMBAR SPINE WITHOUT CONTRAST
TECHNIQUE: Multiplanar, multisequence MR imaging of the lumbar spine was
performed. No intravenous contrast was administered.

[Series 5: T2 · sagittal · 4.0mm · 0.81mm/px · 7 of 18 slices shown (1 of 2)]
[im 1/18]
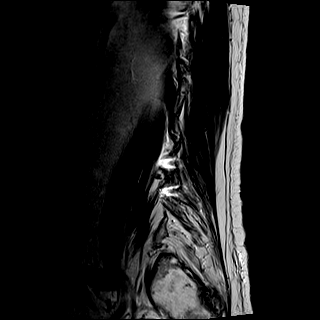
[im 3/18]
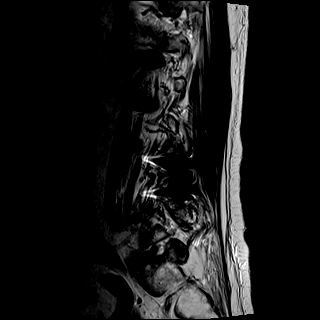
[im 6/18]
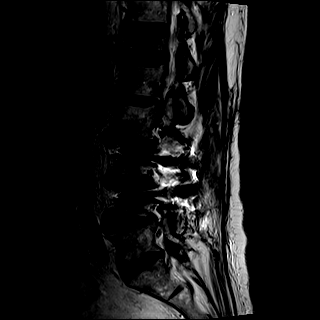
[im 9/18]
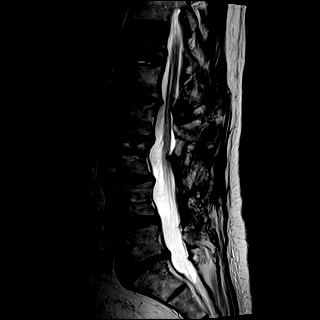
[im 12/18]
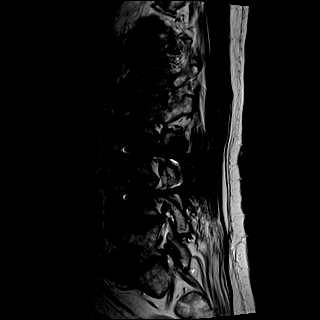
[im 15/18]
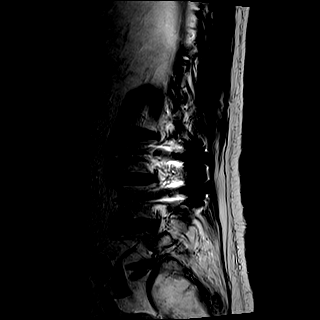
[im 18/18]
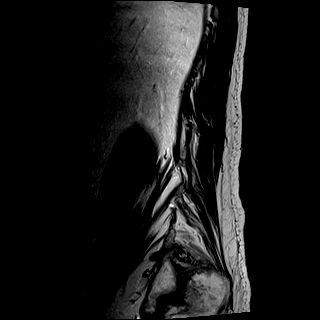

[Series 6: T1 · sagittal · 4.0mm · 0.81mm/px · 7 of 18 slices shown (1 of 2)]
[im 1/18]
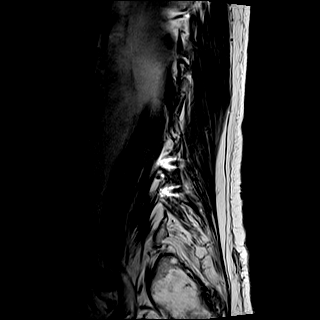
[im 3/18]
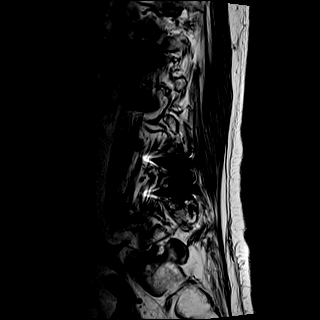
[im 6/18]
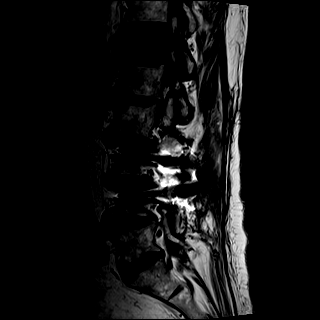
[im 9/18]
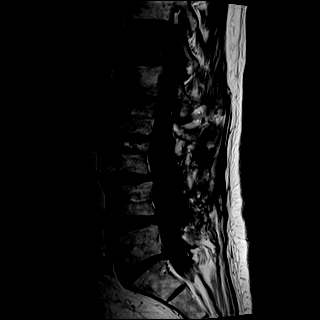
[im 12/18]
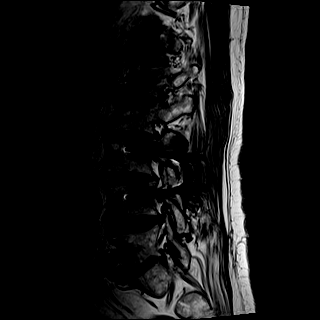
[im 15/18]
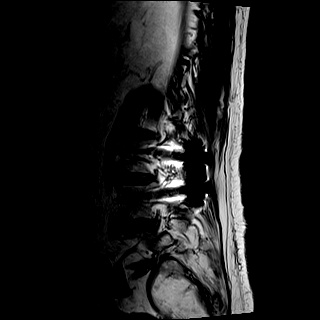
[im 18/18]
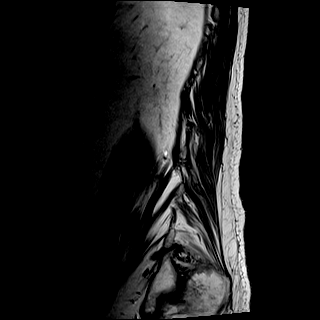

[Series 7: STIR · sagittal · 4.0mm · 0.41mm/px · 1 of 18 slices shown]
[im 1/18]
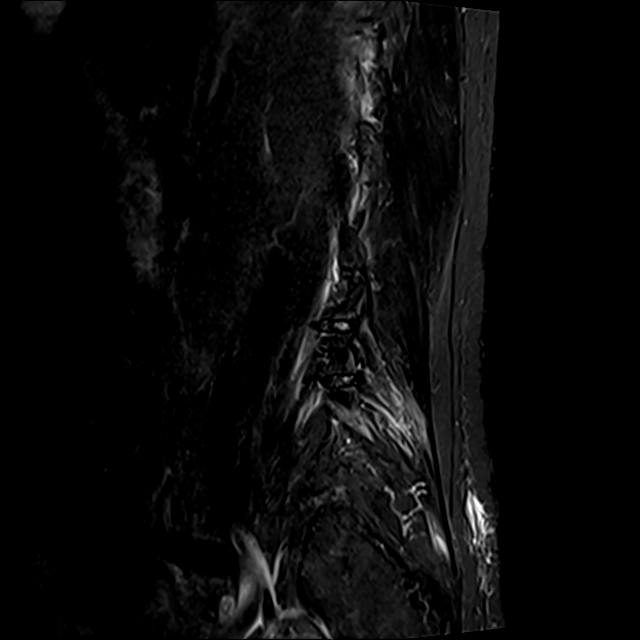

[Series 8: T2 · axial · 4.0mm · 0.78mm/px · z∈[-173,+64]mm · 8 of 41 slices shown (2 of 2)]
[im 1/41]
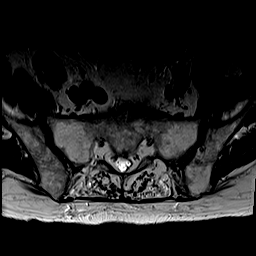
[im 7/41]
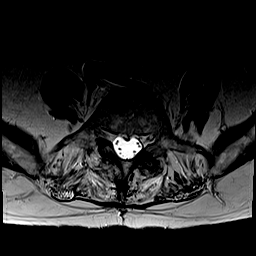
[im 13/41]
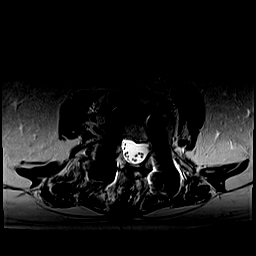
[im 19/41]
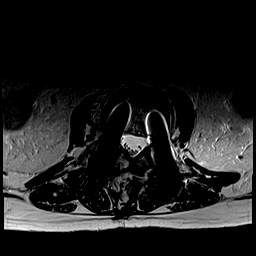
[im 22/41]
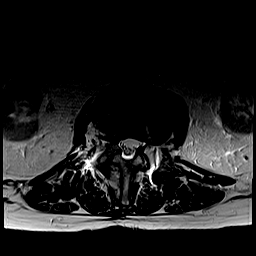
[im 28/41]
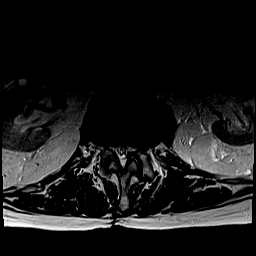
[im 34/41]
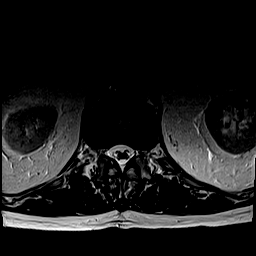
[im 41/41]
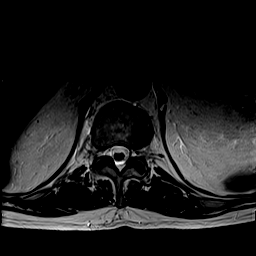

[Series 9: T1 · axial · 4.0mm · 0.39mm/px · z∈[-173,+64]mm · 8 of 41 slices shown (2 of 2)]
[im 1/41]
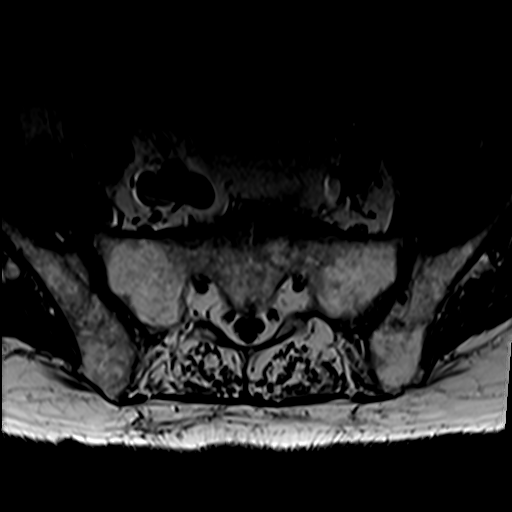
[im 7/41]
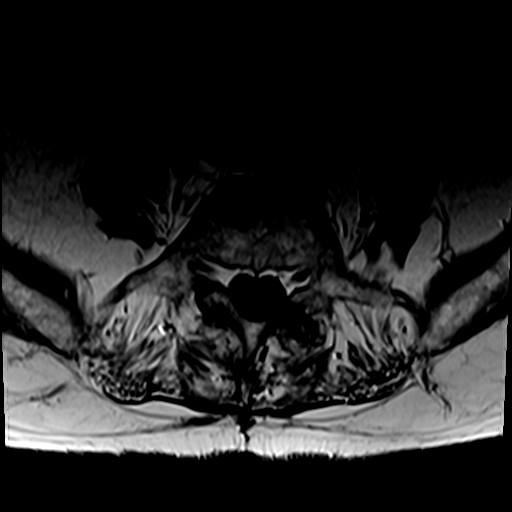
[im 13/41]
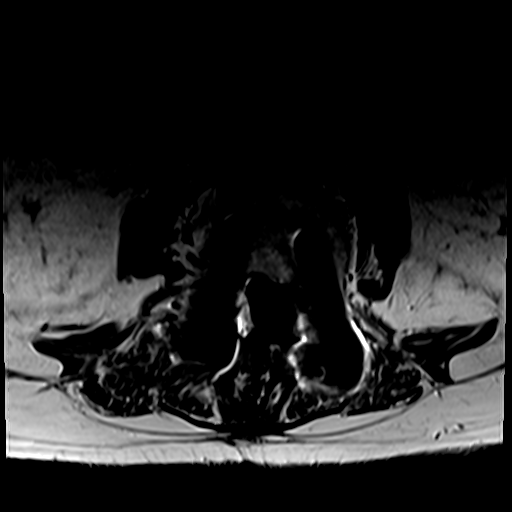
[im 19/41]
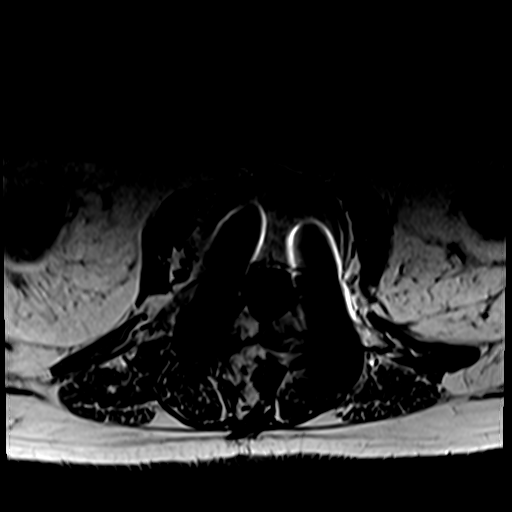
[im 22/41]
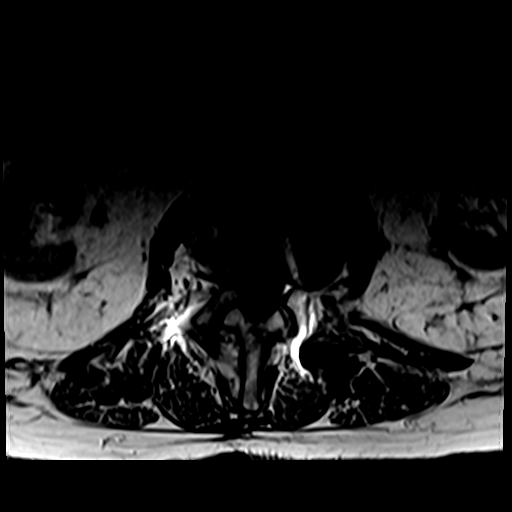
[im 28/41]
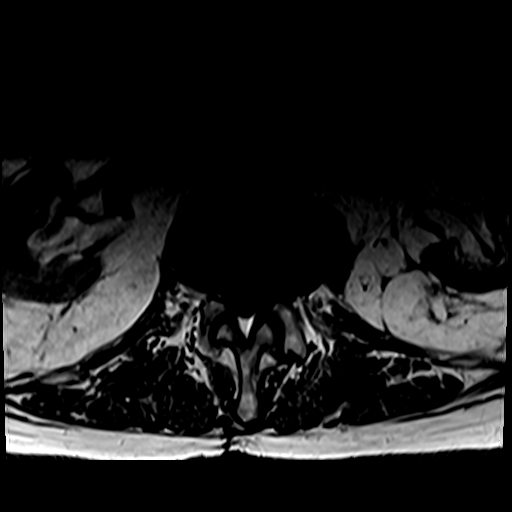
[im 34/41]
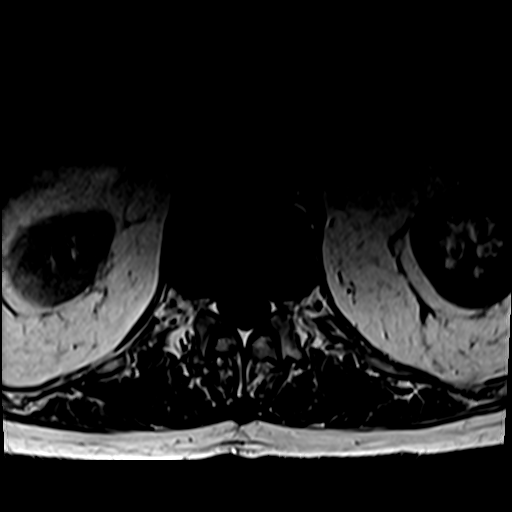
[im 41/41]
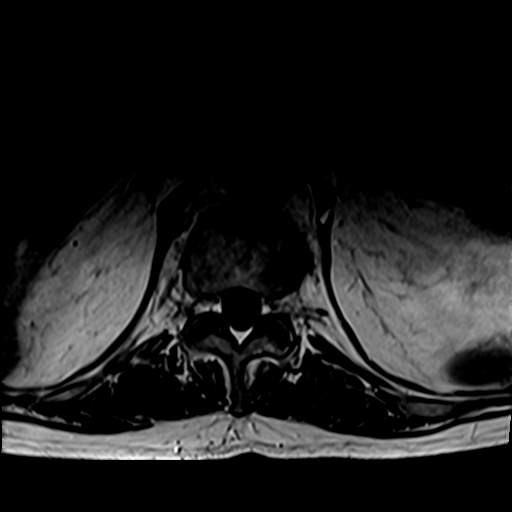

[31 of 48 positions shown; findings below may reference images not displayed]

FINDINGS: Segmentation:  5 lumbar type vertebrae

Alignment:  Levoscoliosis.  Mild L3-4 anterolisthesis.

Vertebrae: Comminuted T12 body with primary hypointense horizontal
fracture plane containing small volume fluid. There is superimposed
marrow edema, reportedly the injury was 1 week ago. Height loss when
compared to L1 measures 70%. There is posterior cortex involvement
and mild buckling without significant retropulsion.

Mild discogenic endplate edema at L1-2.

Conus medullaris and cauda equina: Conus extends to the L1 level.
Conus and cauda equina appear normal.

Paraspinal and other soft tissues: Postoperative scarring and fatty
muscular atrophy inferiorly at sites of prior surgery.

Disc levels:

T12- L1: Unremarkable.

L1-L2: Disc collapse with endplate ridging and disc bulging. Chronic
left foraminal protrusion impinging on the left L1 nerve root.
Degenerative facet spurring with mild to moderate spinal stenosis

L2-L3: Disc narrowing and bulging with mild facet spurring. Patent
canal and foramina

L3-L4: Laminectomy with Posterior-lateral fusion which is likely
solid. No impingement

L4-L5: Laminectomy with patent canal and foramina. Facet spurring
and disc bulging which is stable.

L5-S1:Disc narrowing and bulging. Degenerative facet spurring on
both sides. The canal and foramina are patent
IMPRESSION: 1. Recent/unhealed T12 compression fracture with 70% height loss
centrally. No retropulsion or listhesis.
2. L1-2 left foraminal protrusion and left L1 impingement, chronic.

## 2022-05-02 ENCOUNTER — Ambulatory Visit: Payer: Medicare Other | Admitting: Dermatology

## 2022-05-07 ENCOUNTER — Ambulatory Visit: Payer: Self-pay | Admitting: Urology

## 2022-05-09 ENCOUNTER — Ambulatory Visit: Payer: Medicare Other | Admitting: Urology
